# Patient Record
Sex: Male | Born: 1950 | Race: White | Hispanic: No | Marital: Married | Smoking: Never smoker
Health system: Southern US, Community
[De-identification: ages and names within clinical notes are randomized; demographics above are authoritative.]

## PROBLEM LIST (undated history)

## (undated) DIAGNOSIS — E785 Hyperlipidemia, unspecified: Secondary | ICD-10-CM

## (undated) DIAGNOSIS — R0602 Shortness of breath: Secondary | ICD-10-CM

## (undated) DIAGNOSIS — H9312 Tinnitus, left ear: Secondary | ICD-10-CM

## (undated) DIAGNOSIS — E039 Hypothyroidism, unspecified: Secondary | ICD-10-CM

## (undated) DIAGNOSIS — I2510 Atherosclerotic heart disease of native coronary artery without angina pectoris: Secondary | ICD-10-CM

## (undated) DIAGNOSIS — C44320 Squamous cell carcinoma of skin of unspecified parts of face: Secondary | ICD-10-CM

## (undated) DIAGNOSIS — N189 Chronic kidney disease, unspecified: Secondary | ICD-10-CM

## (undated) DIAGNOSIS — R55 Syncope and collapse: Secondary | ICD-10-CM

## (undated) DIAGNOSIS — I10 Essential (primary) hypertension: Secondary | ICD-10-CM

## (undated) DIAGNOSIS — I209 Angina pectoris, unspecified: Secondary | ICD-10-CM

## (undated) DIAGNOSIS — K219 Gastro-esophageal reflux disease without esophagitis: Secondary | ICD-10-CM

## (undated) HISTORY — PX: TONSILLECTOMY: SUR1361

## (undated) HISTORY — PX: OTHER SURGICAL HISTORY: SHX169

## (undated) HISTORY — DX: Essential (primary) hypertension: I10

## (undated) HISTORY — DX: Shortness of breath: R06.02

## (undated) HISTORY — PX: CARDIAC CATHETERIZATION: SHX172

## (undated) HISTORY — DX: Gastro-esophageal reflux disease without esophagitis: K21.9

## (undated) HISTORY — DX: Hyperlipidemia, unspecified: E78.5

## (undated) HISTORY — DX: Tinnitus, left ear: H93.12

## (undated) HISTORY — DX: Hypothyroidism, unspecified: E03.9

## (undated) HISTORY — DX: Syncope and collapse: R55

---
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
## 1998-08-03 ENCOUNTER — Ambulatory Visit (HOSPITAL_COMMUNITY): Admission: RE | Admit: 1998-08-03 | Discharge: 1998-08-03 | Payer: Self-pay | Admitting: Gastroenterology

## 2003-10-11 ENCOUNTER — Ambulatory Visit (HOSPITAL_COMMUNITY): Admission: RE | Admit: 2003-10-11 | Discharge: 2003-10-11 | Payer: Self-pay | Admitting: Gastroenterology

## 2004-09-18 ENCOUNTER — Ambulatory Visit (HOSPITAL_COMMUNITY): Admission: RE | Admit: 2004-09-18 | Discharge: 2004-09-18 | Payer: Self-pay | Admitting: Internal Medicine

## 2010-07-15 ENCOUNTER — Encounter: Payer: Self-pay | Admitting: Internal Medicine

## 2013-12-22 NOTE — Telephone Encounter (Signed)
This encounter was created in error - please disregard.

## 2014-06-30 DIAGNOSIS — D2311 Other benign neoplasm of skin of right eyelid, including canthus: Secondary | ICD-10-CM | POA: Diagnosis not present

## 2014-06-30 DIAGNOSIS — H43812 Vitreous degeneration, left eye: Secondary | ICD-10-CM | POA: Diagnosis not present

## 2014-06-30 DIAGNOSIS — H01001 Unspecified blepharitis right upper eyelid: Secondary | ICD-10-CM | POA: Diagnosis not present

## 2014-06-30 DIAGNOSIS — H2513 Age-related nuclear cataract, bilateral: Secondary | ICD-10-CM | POA: Diagnosis not present

## 2014-10-24 DIAGNOSIS — R55 Syncope and collapse: Secondary | ICD-10-CM | POA: Diagnosis not present

## 2014-10-24 DIAGNOSIS — R0602 Shortness of breath: Secondary | ICD-10-CM | POA: Diagnosis not present

## 2014-10-24 DIAGNOSIS — E785 Hyperlipidemia, unspecified: Secondary | ICD-10-CM | POA: Diagnosis not present

## 2014-10-24 DIAGNOSIS — Z6823 Body mass index (BMI) 23.0-23.9, adult: Secondary | ICD-10-CM | POA: Diagnosis not present

## 2014-10-24 DIAGNOSIS — I10 Essential (primary) hypertension: Secondary | ICD-10-CM | POA: Diagnosis not present

## 2014-10-28 DIAGNOSIS — I10 Essential (primary) hypertension: Secondary | ICD-10-CM | POA: Diagnosis not present

## 2014-10-28 DIAGNOSIS — E039 Hypothyroidism, unspecified: Secondary | ICD-10-CM | POA: Diagnosis not present

## 2014-10-28 DIAGNOSIS — Z125 Encounter for screening for malignant neoplasm of prostate: Secondary | ICD-10-CM | POA: Diagnosis not present

## 2014-10-28 DIAGNOSIS — E785 Hyperlipidemia, unspecified: Secondary | ICD-10-CM | POA: Diagnosis not present

## 2014-11-04 DIAGNOSIS — I10 Essential (primary) hypertension: Secondary | ICD-10-CM | POA: Diagnosis not present

## 2014-11-04 DIAGNOSIS — D649 Anemia, unspecified: Secondary | ICD-10-CM | POA: Diagnosis not present

## 2014-11-04 DIAGNOSIS — Z0000 Encounter for general adult medical examination without abnormal findings: Secondary | ICD-10-CM | POA: Diagnosis not present

## 2014-11-04 DIAGNOSIS — E039 Hypothyroidism, unspecified: Secondary | ICD-10-CM | POA: Diagnosis not present

## 2014-11-04 DIAGNOSIS — I129 Hypertensive chronic kidney disease with stage 1 through stage 4 chronic kidney disease, or unspecified chronic kidney disease: Secondary | ICD-10-CM | POA: Diagnosis not present

## 2014-11-04 DIAGNOSIS — E785 Hyperlipidemia, unspecified: Secondary | ICD-10-CM | POA: Diagnosis not present

## 2014-11-04 DIAGNOSIS — N401 Enlarged prostate with lower urinary tract symptoms: Secondary | ICD-10-CM | POA: Diagnosis not present

## 2014-11-04 DIAGNOSIS — K219 Gastro-esophageal reflux disease without esophagitis: Secondary | ICD-10-CM | POA: Diagnosis not present

## 2014-11-11 DIAGNOSIS — Z1212 Encounter for screening for malignant neoplasm of rectum: Secondary | ICD-10-CM | POA: Diagnosis not present

## 2014-11-14 ENCOUNTER — Encounter: Payer: Self-pay | Admitting: *Deleted

## 2014-11-16 ENCOUNTER — Encounter: Payer: Self-pay | Admitting: *Deleted

## 2014-11-16 ENCOUNTER — Ambulatory Visit (INDEPENDENT_AMBULATORY_CARE_PROVIDER_SITE_OTHER): Payer: Commercial Managed Care - HMO | Admitting: Internal Medicine

## 2014-11-16 VITALS — BP 156/90 | HR 57 | Ht 72.0 in | Wt 177.0 lb

## 2014-11-16 DIAGNOSIS — R55 Syncope and collapse: Secondary | ICD-10-CM

## 2014-11-16 DIAGNOSIS — R0609 Other forms of dyspnea: Secondary | ICD-10-CM

## 2014-11-16 DIAGNOSIS — G478 Other sleep disorders: Secondary | ICD-10-CM | POA: Diagnosis not present

## 2014-11-16 DIAGNOSIS — G4730 Sleep apnea, unspecified: Secondary | ICD-10-CM

## 2014-11-16 NOTE — Patient Instructions (Signed)
Medication Instructions:  Your physician recommends that you continue on your current medications as directed. Please refer to the Current Medication list given to you today.  Labwork: None ordered  Testing/Procedures: Your physician has requested that you have an echocardiogram. Echocardiography is a painless test that uses sound waves to create images of your heart. It provides your doctor with information about the size and shape of your heart and how well your heart's chambers and valves are working. This procedure takes approximately one hour. There are no restrictions for this procedure.  Your physician has requested that you have a stress myoview. For further information please visit [email or web address]. Please follow instruction sheet as given.  Your physician has recommended that you have a sleep study. This test records several body functions during sleep, including: brain activity, eye movement, oxygen and carbon dioxide blood levels, heart rate and rhythm, breathing rate and rhythm, the flow of air through your mouth and nose, snoring, body muscle movements, and chest and belly movement.  Follow-Up: Your physician wants you to follow-up in: 6 months with Dr. Caryl Comes. You will receive a reminder letter in the mail two months in advance. If you don't receive a letter, please call our office to schedule the follow-up appointment.   Thank you for choosing [facility]!!

## 2014-11-16 NOTE — Progress Notes (Signed)
ELECTROPHYSIOLOGY CONSULT NOTE  Patient ID: Cristian Cantrell, MRN: [id number], DOB/AGE: 01-16-1951 64 y.o. Admit date: (Not on file) Date of Consult: 11/16/2014  Primary Physician: Geoffery Lyons, MD Primary Cardiologistnew    Chief Complaint: suncope and dyspnea on exertion    HPI Cristian Cantrell is a 64 y.o. male  Seen because of an episode of syncope. He has a long-standing history of hypertension 5-10 years and a couple year history of orthostatic lightheadedness. He does not seem to be aggravated by showers or disease. He typically is associated with very abrupt changes in position. He was playing [employer] recently with his grandsons he was squatting to pick up Frisbee and a one occasion he stepped up and got dizzy and then collapsed to the ground. He was seen by family medical peoples  Described Him As Being Relatively Pale. They Are Medical People, but Apparently Did Recheck the Pulse Although This May Be Referred by Same That He Was "Had a Very Low Blood Pressure".  His dizziness has been problematic for some time and it was felt to be related to his high blood pressure that was made worse by augmented hypertensive therapy.  He also is complaints over the last 2-3 months of exertional dyspnea. This is notable climbing a flight of stairs and typically takes a couple of minutes to fully abate. It is unassociated with chest discomfort. Cardiac risk factors are notable for blood pressure and hyperlipidemia with LDL of 140+,  negative for family history./  It seems to be a little bit less problematic now that was a month or so ago. It was notably predictable. He was unaware accompanying palpitations.  He has not had problems with orthopnea nocturnal dyspnea or peripheral edema.     Past Medical History  Diagnosis Date  . Syncope   . HLD (hyperlipidemia)   . SOB (shortness of breath)   . HTN (hypertension)   . GERD (gastroesophageal reflux disease)   . Tinnitus of left ear   .  Hypothyroidism       Surgical History: History reviewed. No pertinent past surgical history.   Home Meds: Prior to Admission medications   Medication Sig Start Date End Date Taking? Authorizing Provider  amLODipine (NORVASC) 5 MG tablet Take 2.5 mg by mouth daily.    Yes Historical Provider, MD  BYSTOLIC 10 MG tablet Take 5 mg by mouth daily.  08/30/14  Yes Historical Provider, MD  cholecalciferol (VITAMIN D) 1000 UNITS tablet Take 1,000 Units by mouth daily.   Yes Historical Provider, MD  iron polysaccharides (NIFEREX) 150 MG capsule Take 150 mg by mouth 2 (two) times daily.   Yes Historical Provider, MD  levothyroxine (SYNTHROID, LEVOTHROID) 50 MCG tablet Take 50 mcg by mouth daily.  11/04/14  Yes Historical Provider, MD  losartan (COZAAR) 100 MG tablet Take 100 mg by mouth daily.   Yes Historical Provider, MD  omeprazole (PRILOSEC) 20 MG capsule Take 20 mg by mouth daily. 10/12/14  Yes Historical Provider, MD  tamsulosin (FLOMAX) 0.4 MG CAPS capsule Take 0.4 mg by mouth daily. 08/30/14  Yes Historical Provider, MD       Allergies: Not on File  History   Social History  . Marital Status: Married    Spouse Name: N/A  . Number of Children: N/A  . Years of Education: N/A   Occupational History  . Not on file.   Social History Main Topics  . Smoking status: Never Smoker   . Smokeless tobacco: Not  on file  . Alcohol Use: No  . Drug Use: No  . Sexual Activity: Not on file   Other Topics Concern  . Not on file   Social History Narrative     Family History  Problem Relation Age of Onset  . Hypertension Mother   . Parkinson's disease Father      ROS:  Please see the history of present illness.   Negative except a history of bleeding from his bowels cause of which was never elucidated. All other systems reviewed and negative.    Physical Exam:   Blood pressure 156/90, pulse 57, height 6' (1.829 m), weight 177 lb (80.287 kg). General: Well developed, well nourished male in  no acute distress. Head: Normocephalic, atraumatic, sclera non-icteric, no xanthomas, nares are without discharge. EENT: normal Lymph Nodes:  none Back: without scoliosis/kyphosis  no CVA tendersness Neck: Negative for carotid bruits. JVD not elevated. Lungs: Clear bilaterally to auscultation without wheezes, rales, or rhonchi. Breathing is unlabored. Heart: RRR with S1 S2. No  murmur , rubs, or gallops appreciated. Abdomen: Soft, non-tender, non-distended with normoactive bowel sounds. No hepatomegaly. No rebound/guarding. No obvious abdominal masses. Msk:  Strength and tone appear normal for age. Extremities: No clubbing or cyanosis. No edema.  Distal pedal pulses are 2+ and equal bilaterally. Skin: Warm and Dry Neuro: Alert and oriented X 3. CN III-XII intact Grossly normal sensory and motor function . Psych:  Responds to questions appropriately with a normal affect.      Labs: Cardiac Enzymes No results for input(s): CKTOTAL, CKMB, TROPONINI in the last 72 hours. CBC No results found for: WBC, HGB, HCT, MCV, PLT PROTIME: No results for input(s): LABPROT, INR in the last 72 hours. Chemistry No results for input(s): NA, K, CL, CO2, BUN, CREATININE, CALCIUM, PROT, BILITOT, ALKPHOS, ALT, AST, GLUCOSE in the last 168 hours.  Invalid input(s): LABALBU Lipids No results found for: CHOL, HDL, LDLCALC, TRIG BNP No results found for: PROBNP Thyroid Function Tests: No results for input(s): TSH, T4TOTAL, T3FREE, THYROIDAB in the last 72 hours.  Invalid input(s): FREET3    Miscellaneous No results found for: DDIMER  Radiology/Studies:  No results found.  EKG: NSR 60  19/09/41 O/w normal   Assessment and Plan:  Hypertension-poorly controlled  Sleep disordered breathing  Syncope  Dyspnea on exertion  The patient has syncope which is almost certainly orthostatic in the context of orthostatic lightheadedness. He has moderately long-standing hypertension for which  he takes  multiple medications. This is going to be a challenge. We discussed the interface between hypertension vascular compliance and orthostatic lightheadedness.  We have discussed the physiology of orthostatic lightheadedness and the importance of maintaining volume status, albeit without salt, avoidance of triggers i.e. heat, the role of isometric contraction to prime the system and mitigate symptoms, and a long-term interactions between hypertension and poor vascular compliance and orthostatic intolerance. We also discussed the aggravating effect of his alpha-blocker but this apparently is quite important in his urinary flow.  For now looking at alternative mechanisms for treating his hypertension that obviate the need for up titration of medications seems prudent. We discussed the value of high potassium diet, exercise, and also given his sleep disordered breathing, the importance of a sleep study and aggressive therapy as this is the most common reversible cause of hypertension.  His dyspnea on exertion is concerning. It may well be an ischemic equivalent given its reproducibility and its time of offset. We will undertake Myoview scanning with stress  testing as his pretest likelihood is sufficiently high and I think standard stress testing is insufficiently sensitive.  We will also undertake an echocardiogram given his syncope looking for evidence of structural heart disease valvular issues that might help further illuminate the cause of dyspnea.  He has symptoms of cold intolerance. I wonder whether this related to one of his drugs and is suggested that we undertake a surgical drug limitation trial.     Virl Axe

## 2014-12-01 ENCOUNTER — Telehealth (HOSPITAL_COMMUNITY): Payer: Self-pay

## 2014-12-01 NOTE — Telephone Encounter (Signed)
Left message on voicemail in reference to upcoming appointment scheduled for 12-06-2014. Phone number given for a call back so details instructions can be given. Makayela Secrest A   

## 2014-12-05 ENCOUNTER — Telehealth (HOSPITAL_COMMUNITY): Payer: Self-pay | Admitting: *Deleted

## 2014-12-05 NOTE — Telephone Encounter (Signed)
Left message on voicemail in reference to upcoming appointment scheduled for 12/06/14 Left message on voicemail in reference to upcoming appointment scheduled for Sencere Symonette, Ranae Palms  .

## 2014-12-06 ENCOUNTER — Other Ambulatory Visit: Payer: Self-pay

## 2014-12-06 ENCOUNTER — Ambulatory Visit (HOSPITAL_BASED_OUTPATIENT_CLINIC_OR_DEPARTMENT_OTHER): Payer: Commercial Managed Care - HMO

## 2014-12-06 ENCOUNTER — Ambulatory Visit (HOSPITAL_COMMUNITY): Payer: Commercial Managed Care - HMO | Attending: Internal Medicine

## 2014-12-06 DIAGNOSIS — R9439 Abnormal result of other cardiovascular function study: Secondary | ICD-10-CM | POA: Diagnosis not present

## 2014-12-06 DIAGNOSIS — I071 Rheumatic tricuspid insufficiency: Secondary | ICD-10-CM | POA: Diagnosis not present

## 2014-12-06 DIAGNOSIS — R55 Syncope and collapse: Secondary | ICD-10-CM

## 2014-12-06 DIAGNOSIS — R0609 Other forms of dyspnea: Secondary | ICD-10-CM | POA: Diagnosis not present

## 2014-12-06 LAB — MYOCARDIAL PERFUSION IMAGING
CHL CUP NUCLEAR SSS: 4
CHL CUP RESTING HR STRESS: 47 {beats}/min
CSEPED: 8 min
CSEPEDS: 0 s
Estimated workload: 10.1 METS
LHR: 0.3
LVDIAVOL: 104 mL
LVSYSVOL: 47 mL
MPHR: 154 {beats}/min
NUC STRESS TID: 1.01
Nuc Stress EF: 55 %
Peak HR: 141 {beats}/min
Percent HR: 91 %
SDS: 3
SRS: 1

## 2014-12-06 MED ORDER — TECHNETIUM TC 99M SESTAMIBI GENERIC - CARDIOLITE
11.0000 | Freq: Once | INTRAVENOUS | Status: AC | PRN
  Administered 2014-12-06: 11 via INTRAVENOUS

## 2014-12-06 MED ORDER — TECHNETIUM TC 99M SESTAMIBI GENERIC - CARDIOLITE
33.0000 | Freq: Once | INTRAVENOUS | Status: AC | PRN
  Administered 2014-12-06: 33 via INTRAVENOUS

## 2014-12-12 ENCOUNTER — Encounter: Payer: Self-pay | Admitting: *Deleted

## 2014-12-13 ENCOUNTER — Ambulatory Visit (INDEPENDENT_AMBULATORY_CARE_PROVIDER_SITE_OTHER): Payer: Commercial Managed Care - HMO | Admitting: Internal Medicine

## 2014-12-13 ENCOUNTER — Encounter: Payer: Self-pay | Admitting: Internal Medicine

## 2014-12-13 VITALS — BP 134/84 | HR 61 | Ht 72.0 in | Wt 179.6 lb

## 2014-12-13 DIAGNOSIS — R0609 Other forms of dyspnea: Secondary | ICD-10-CM

## 2014-12-13 DIAGNOSIS — R55 Syncope and collapse: Secondary | ICD-10-CM | POA: Diagnosis not present

## 2014-12-13 NOTE — Patient Instructions (Signed)
Medication Instructions:  Your physician recommends that you continue on your current medications as directed. Please refer to the Current Medication list given to you today.  Labwork: None ordered  Testing/Procedures: Your physician has requested that you have a cardiac MRI. Cardiac MRI uses a computer to create images of your heart as its beating, producing both still and moving pictures of your heart and major blood vessels. For further information please visit [email or web address] Please follow the instruction sheet given to you today for more information.  Follow-Up: Continue current follow up in November with Dr. Caryl Comes.  Thank you for choosing [facility]!!

## 2014-12-13 NOTE — Progress Notes (Signed)
      Patient Care Team: Burnard Bunting, MD as PCP - General (Internal Medicine)   HPI  Cristian Cantrell is a 64 y.o. male Seen in followup of syncope for which during evaluation Myoview was noted to be abnormal with a resting perfusion defect and an peri-infarct "ischemia".  He continues to struggle with dizziness. He has had no recurrent syncope.  Echocardiogram demonstrated normal LV function. Myoview demonstrated>> There is a medium defect of mild severity present in the mid anteroseptal, apical anterior and apical septal location. The defect is partially reversible suggestive of ischemia  Past Medical History  Diagnosis Date  . Syncope   . HLD (hyperlipidemia)   . SOB (shortness of breath)   . HTN (hypertension)   . GERD (gastroesophageal reflux disease)   . Tinnitus of left ear   . Hypothyroidism     Past Surgical History  Procedure Laterality Date  . None      Current Outpatient Prescriptions  Medication Sig Dispense Refill  . amLODipine (NORVASC) 5 MG tablet Take 2.5 mg by mouth daily.     Marland Kitchen BYSTOLIC 10 MG tablet Take 5 mg by mouth daily.     . cholecalciferol (VITAMIN D) 1000 UNITS tablet Take 1,000 Units by mouth daily.    . iron polysaccharides (NIFEREX) 150 MG capsule Take 150 mg by mouth 2 (two) times daily.    Marland Kitchen levothyroxine (SYNTHROID, LEVOTHROID) 50 MCG tablet Take 50 mcg by mouth daily.     Marland Kitchen losartan (COZAAR) 100 MG tablet Take 100 mg by mouth daily.    Marland Kitchen omeprazole (PRILOSEC) 20 MG capsule Take 20 mg by mouth daily.    . tamsulosin (FLOMAX) 0.4 MG CAPS capsule Take 0.4 mg by mouth daily.     No current facility-administered medications for this visit.    No Known Allergies  Review of Systems negative except from HPI and PMH  Physical Exam BP 134/84 mmHg  Pulse 61  Ht 6' (1.829 m)  Wt 179 lb 9.6 oz (81.466 kg)  BMI 24.35 kg/m2  SpO2 99% Well developed and well nourished in no acute distress HENT normal E scleral and icterus clear Neck  Supple JVP flat; carotids brisk and full Clear to ausculation  Regular rate and rhythm, no murmurs gallops or rub Soft with active bowel sounds No clubbing cyanosis  Edema Alert and oriented, grossly normal motor and sensory function Skin Warm and Dry    Assessment and  Plan  Syncope  Abnormal Myoview  The patient and his wife and I reviewed the Myoview images indicative of a prior perfusion defect with some peri-infarct ischemia. In the context of his syncope, importance of this is heightened as the potential for malignant arrhythmias to rise from the presence of substrate. We have discussed 2 strategies, the first would be cardiac MR to exclude a false positive stress scan. In the event tha MRI were normal, no further cardiac evaluation would be indicated. If the spine MRI demonstrated a prior infarction, catheterization and negative EP testing would be appropriate probably followed by, in the event that he were noninducible, implantation of a loop recorder.  I have recommended that he follow-up with his PCP regarding his alpha-blocker for urinary hesitancy as he struggles with dizziness  We spent more than 50% of our >45 min visit in face to face counseling regarding the above

## 2014-12-14 ENCOUNTER — Encounter: Payer: Self-pay | Admitting: Internal Medicine

## 2014-12-19 ENCOUNTER — Other Ambulatory Visit: Payer: Self-pay

## 2014-12-20 ENCOUNTER — Telehealth: Payer: Self-pay | Admitting: *Deleted

## 2014-12-20 DIAGNOSIS — Z01812 Encounter for preprocedural laboratory examination: Secondary | ICD-10-CM

## 2014-12-20 NOTE — Telephone Encounter (Signed)
Patient having cardiac MRI on Friday. Called to arrange pre-lab.  Patient will come by office tomorrow for lab work

## 2014-12-21 ENCOUNTER — Other Ambulatory Visit (INDEPENDENT_AMBULATORY_CARE_PROVIDER_SITE_OTHER): Payer: Commercial Managed Care - HMO | Admitting: *Deleted

## 2014-12-21 DIAGNOSIS — Z01812 Encounter for preprocedural laboratory examination: Secondary | ICD-10-CM

## 2014-12-21 LAB — BASIC METABOLIC PANEL
BUN: 13 mg/dL (ref 6–23)
CALCIUM: 9.4 mg/dL (ref 8.4–10.5)
CO2: 31 meq/L (ref 19–32)
CREATININE: 1.42 mg/dL (ref 0.40–1.50)
Chloride: 105 mEq/L (ref 96–112)
GFR: 52.97 mL/min — AB (ref >60.00)
GLUCOSE: 97 mg/dL (ref 70–99)
Potassium: 4 mEq/L (ref 3.5–5.1)
SODIUM: 141 meq/L (ref 135–145)

## 2014-12-23 ENCOUNTER — Ambulatory Visit (HOSPITAL_COMMUNITY)
Admission: RE | Admit: 2014-12-23 | Discharge: 2014-12-23 | Disposition: A | Discharge status: Home / Self Care | Payer: Commercial Managed Care - HMO | Source: Ambulatory Visit | Attending: Internal Medicine | Admitting: Internal Medicine

## 2014-12-23 DIAGNOSIS — R55 Syncope and collapse: Secondary | ICD-10-CM | POA: Insufficient documentation

## 2014-12-23 DIAGNOSIS — R0609 Other forms of dyspnea: Secondary | ICD-10-CM

## 2014-12-23 MED ORDER — GADOBENATE DIMEGLUMINE 529 MG/ML IV SOLN
27.0000 mL | Freq: Once | INTRAVENOUS | Status: AC | PRN
  Administered 2014-12-23: 27 mL via INTRAVENOUS

## 2014-12-29 ENCOUNTER — Encounter: Payer: Self-pay | Admitting: Internal Medicine

## 2014-12-29 ENCOUNTER — Other Ambulatory Visit (HOSPITAL_COMMUNITY): Payer: Commercial Managed Care - HMO

## 2014-12-29 NOTE — Telephone Encounter (Signed)
New message ° ° ° ° ° °Want MRI results  °

## 2015-01-03 NOTE — Telephone Encounter (Signed)
This encounter was created in error - please disregard.

## 2015-01-27 ENCOUNTER — Telehealth: Payer: Self-pay | Admitting: Cardiology

## 2015-01-27 NOTE — Telephone Encounter (Signed)
LM on pt's home# sleep study is now authorized.

## 2015-01-30 ENCOUNTER — Ambulatory Visit (HOSPITAL_BASED_OUTPATIENT_CLINIC_OR_DEPARTMENT_OTHER): Payer: Commercial Managed Care - HMO | Attending: Internal Medicine

## 2015-01-30 DIAGNOSIS — R0683 Snoring: Secondary | ICD-10-CM | POA: Diagnosis not present

## 2015-01-30 DIAGNOSIS — G4733 Obstructive sleep apnea (adult) (pediatric): Secondary | ICD-10-CM | POA: Diagnosis not present

## 2015-01-30 DIAGNOSIS — I10 Essential (primary) hypertension: Secondary | ICD-10-CM | POA: Diagnosis present

## 2015-01-30 DIAGNOSIS — G4730 Sleep apnea, unspecified: Secondary | ICD-10-CM

## 2015-01-30 DIAGNOSIS — G478 Other sleep disorders: Secondary | ICD-10-CM

## 2015-02-01 ENCOUNTER — Telehealth: Payer: Self-pay | Admitting: Cardiology

## 2015-02-01 NOTE — Telephone Encounter (Signed)
Please let patient know that sleep study showed no significant sleep apnea.    

## 2015-02-01 NOTE — Sleep Study (Signed)
SLEEP STUDY    Patient Name: Cristian Cantrell, Cristian Cantrell Date: 01/30/2015 MRN: [id number] Gender: Male D.O.B: 11-29-1950 Age (years): 64 Referring Provider: Virl Axe Interpreting Physician: Fransico Him MD, ABSM RPSGT: Madelon Lips  Height (inches): 72 Weight (lbs): 180 BMI: 24 Neck Size: 15.00   CLINICAL INFORMATION  Sleep Study Type: NPSG  Indication for sleep study: Hypertension, Snoring  Epworth Sleepiness Score: 10  MEDICATIONS Medications taken by the patient : Amlodipine, Bystolic, Vitamin D, Synthroid, Iron, , Cozaar, Prilosec, Flonase Medications administered by patient during sleep study : No sleep medicine administered.  SLEEP STUDY TECHNIQUE As per the AASM Manual for the Scoring of Sleep and Associated Events v2.3 (April 2016) with a hypopnea requiring 4% desaturations.  The channels recorded and monitored were frontal, central and occipital EEG, electrooculogram (EOG), submentalis EMG (chin), nasal and oral airflow, thoracic and abdominal wall motion, anterior tibialis EMG, snore microphone, electrocardiogram, and pulse oximetry.  RESPIRATORY PARAMETERS There were a total of 22 respiratory disturbances out of which 2 were apneas ( 2 obstructive, 0 mixed, 0 central) and 20 hypopneas. The apnea/hypopnea index (AHI) was 5.0 events/hour. The central sleep apnea index was 0.0 events/hour. The REM AHI was 2.2 events/hour and NREM AHI was 5.3 events/hour. The supine AHI was 27.5 events/hour and the non supine AHI was 0.81 supine during 15.71% of sleep. Respiratory disturbances were associated with oxygen desaturation down to a nadir of 80.00% during sleep. The mean oxygen saturation during the study was 93.54%.  SLEEP ARCHITECTURE The study was initiated at 10:13:57 PM and terminated at 4:47:04 AM. The total recorded time was 393.1 minutes. EEG confirmed total sleep time was 264.2 minutes yielding a sleep efficiency of 67.2%. Sleep onset after lights out was 27.9  minutes with a REM latency of 78.5 minutes. The patient spent 11.92% of the night in stage N1 sleep, 77.86% in stage N2 sleep, 0.00% in stage N3 and 10.22% in REM. Wake after sleep onset (WASO) was 101.0 minutes. The Arousal Index was 5.9/hour. Split Night criteria was not met.  LEG MOVEMENT DATA The total Periodic Limb Movements of Sleep (PLMS) were 98. The PLMS index was 22.26 .  CARDIAC DATA The 2 lead EKG demonstrated sinus rhythm. The mean heart rate was 54.87 beats per minute. Other EKG findings include: None.  IMPRESSIONS Very mild obstructive sleep apnea occurred during this study (AHI = 5.0/hour).  Events mainly occurred on the supine position. No significant central sleep apnea occurred during this study (CAI = 0.0). Severe oxygen desaturation was noted during this study (Min O2 = 80.00).  The time spent with oxygen saturations < 88% was 2.5 minutes.  The patient snored with Moderate snoring volume during the diagnostic portion of the study. No cardiac abnormalities were noted during this study. Mild periodic limb movements of sleep occurred during the study.  DIAGNOSIS Very mild Obstructive Sleep Apnea/Hypopnea Sydnrome.    RECOMMENDATIONS Given minimal AHI with minimal oxygen saturations <88%, no indication for CPAP therapy at this time. Positional therapy avoiding supine position during sleep. Avoid alcohol, sedatives and other CNS depressants that may worsen sleep apnea and disrupt normal sleep architecture. Sleep hygiene should be reviewed to assess factors that may improve sleep quality. Weight management and regular exercise should be initiated or continued.  Signed: Fransico Him, MD ABSM Diplomate, American Board of Sleep Medicine

## 2015-02-02 NOTE — Telephone Encounter (Signed)
Patient informed of results.  Stated verbal understanding 

## 2015-05-03 DIAGNOSIS — D239 Other benign neoplasm of skin, unspecified: Secondary | ICD-10-CM | POA: Diagnosis not present

## 2015-05-03 DIAGNOSIS — C44320 Squamous cell carcinoma of skin of unspecified parts of face: Secondary | ICD-10-CM

## 2015-05-03 DIAGNOSIS — C44321 Squamous cell carcinoma of skin of nose: Secondary | ICD-10-CM | POA: Diagnosis not present

## 2015-05-03 DIAGNOSIS — L821 Other seborrheic keratosis: Secondary | ICD-10-CM | POA: Diagnosis not present

## 2015-05-03 HISTORY — DX: Squamous cell carcinoma of skin of unspecified parts of face: C44.320

## 2015-05-03 HISTORY — PX: SKIN BIOPSY: SHX1

## 2015-05-15 DIAGNOSIS — I10 Essential (primary) hypertension: Secondary | ICD-10-CM | POA: Diagnosis not present

## 2015-05-15 DIAGNOSIS — N401 Enlarged prostate with lower urinary tract symptoms: Secondary | ICD-10-CM | POA: Diagnosis not present

## 2015-05-15 DIAGNOSIS — K219 Gastro-esophageal reflux disease without esophagitis: Secondary | ICD-10-CM | POA: Diagnosis not present

## 2015-05-15 DIAGNOSIS — E039 Hypothyroidism, unspecified: Secondary | ICD-10-CM | POA: Diagnosis not present

## 2015-05-15 DIAGNOSIS — I129 Hypertensive chronic kidney disease with stage 1 through stage 4 chronic kidney disease, or unspecified chronic kidney disease: Secondary | ICD-10-CM | POA: Diagnosis not present

## 2015-05-15 DIAGNOSIS — D649 Anemia, unspecified: Secondary | ICD-10-CM | POA: Diagnosis not present

## 2015-05-15 DIAGNOSIS — N183 Chronic kidney disease, stage 3 (moderate): Secondary | ICD-10-CM | POA: Diagnosis not present

## 2015-05-15 DIAGNOSIS — E785 Hyperlipidemia, unspecified: Secondary | ICD-10-CM | POA: Diagnosis not present

## 2015-05-30 DIAGNOSIS — R0602 Shortness of breath: Secondary | ICD-10-CM | POA: Insufficient documentation

## 2015-05-30 DIAGNOSIS — R55 Syncope and collapse: Secondary | ICD-10-CM | POA: Insufficient documentation

## 2015-05-30 DIAGNOSIS — H9312 Tinnitus, left ear: Secondary | ICD-10-CM | POA: Insufficient documentation

## 2015-05-30 DIAGNOSIS — E782 Mixed hyperlipidemia: Secondary | ICD-10-CM | POA: Insufficient documentation

## 2015-05-30 DIAGNOSIS — E785 Hyperlipidemia, unspecified: Secondary | ICD-10-CM | POA: Insufficient documentation

## 2015-05-30 DIAGNOSIS — E039 Hypothyroidism, unspecified: Secondary | ICD-10-CM | POA: Insufficient documentation

## 2015-05-30 DIAGNOSIS — I10 Essential (primary) hypertension: Secondary | ICD-10-CM | POA: Insufficient documentation

## 2015-05-30 DIAGNOSIS — K219 Gastro-esophageal reflux disease without esophagitis: Secondary | ICD-10-CM | POA: Insufficient documentation

## 2015-06-01 ENCOUNTER — Ambulatory Visit (INDEPENDENT_AMBULATORY_CARE_PROVIDER_SITE_OTHER): Payer: Commercial Managed Care - HMO | Admitting: Internal Medicine

## 2015-06-01 ENCOUNTER — Encounter: Payer: Self-pay | Admitting: Internal Medicine

## 2015-06-01 VITALS — BP 124/72 | HR 76 | Ht 72.0 in | Wt 178.0 lb

## 2015-06-01 DIAGNOSIS — G478 Other sleep disorders: Secondary | ICD-10-CM

## 2015-06-01 DIAGNOSIS — R55 Syncope and collapse: Secondary | ICD-10-CM

## 2015-06-01 DIAGNOSIS — G4730 Sleep apnea, unspecified: Secondary | ICD-10-CM

## 2015-06-01 NOTE — Patient Instructions (Signed)
Medication Instructions:  Your physician recommends that you continue on your current medications as directed. Please refer to the Current Medication list given to you today.  Labwork: None ordered  Testing/Procedures: None ordered  Follow-Up: No follow up is needed at this time with Dr. Caryl Comes.  He will see you on an as needed basis.  Thank you for choosing [facility]!!   Alvis Lemmings, RN [phone]

## 2015-06-01 NOTE — Progress Notes (Signed)
      Patient Care Team: Burnard Bunting, MD as PCP - General (Internal Medicine)   HPI  Cristian Cantrell is a 65 y.o. male Seen in followup of syncope for which during evaluation Myoview was noted to be abnormal with a resting perfusion defect and an peri-infarct "ischemia".  He continues to struggle with dizziness. He has had no recurrent syncope.  Echocardiogram demonstrated normal LV function. Myoview demonstrated>> There is a medium defect of mild severity present in the mid anteroseptal, apical anterior and apical septal location. The defect is partially reversible suggestive of ischemia;   This was followed up by a  Cardiac MR which demonstrated no infarction.  Past Medical History  Diagnosis Date  . Syncope   . HLD (hyperlipidemia)   . SOB (shortness of breath)   . HTN (hypertension)   . GERD (gastroesophageal reflux disease)   . Tinnitus of left ear   . Hypothyroidism     Past Surgical History  Procedure Laterality Date  . None      Current Outpatient Prescriptions  Medication Sig Dispense Refill  . amLODipine (NORVASC) 5 MG tablet Take 2.5 mg by mouth daily.     Marland Kitchen BYSTOLIC 10 MG tablet Take 5 mg by mouth daily.     . cholecalciferol (VITAMIN D) 1000 UNITS tablet Take 1,000 Units by mouth daily.    Marland Kitchen levothyroxine (SYNTHROID, LEVOTHROID) 50 MCG tablet Take 50 mcg by mouth daily.     Marland Kitchen losartan (COZAAR) 100 MG tablet Take 100 mg by mouth daily.    Marland Kitchen omeprazole (PRILOSEC) 20 MG capsule Take 20 mg by mouth daily.    . tamsulosin (FLOMAX) 0.4 MG CAPS capsule Take 0.4 mg by mouth daily.     No current facility-administered medications for this visit.    No Known Allergies  Review of Systems negative except from HPI and PMH  Physical Exam BP 124/72 mmHg  Pulse 76  Ht 6' (1.829 m)  Wt 178 lb (80.74 kg)  BMI 24.14 kg/m2  SpO2 97% Well developed and well nourished in no acute distress HENT normal E scleral and icterus clear Neck Supple JVP flat; carotids  brisk and full Clear to ausculation  Regular rate and rhythm, no murmurs gallops or rub Soft with active bowel sounds No clubbing cyanosis  Edema Alert and oriented, grossly normal motor and sensory function Skin Warm and Dry    Assessment and  Plan  Syncope  Abnormal Myoview  The patient and his wife and I reviewed the Myoview images indicative of a prior perfusion defect with some peri-infarct ischemia. In the context of his syncope, importance of this is heightened as the potential for malignant arrhythmias to rise from the presence of substrate. We have discussed 2 strategies, the first would be cardiac MR to exclude a false positive stress scan. In the event tha MRI were normal, no further cardiac evaluation would be indicated. If the spine MRI demonstrated a prior infarction, catheterization and negative EP testing would be appropriate probably followed by, in the event that he were noninducible, implantation of a loop recorder.  I have recommended that he follow-up with his PCP regarding his alpha-blocker for urinary hesitancy as he struggles with dizziness  We spent more than 50% of our >45 min visit in face to face counseling regarding the above

## 2015-06-08 DIAGNOSIS — C44321 Squamous cell carcinoma of skin of nose: Secondary | ICD-10-CM | POA: Diagnosis not present

## 2015-07-04 DIAGNOSIS — H524 Presbyopia: Secondary | ICD-10-CM | POA: Diagnosis not present

## 2015-07-04 DIAGNOSIS — H43813 Vitreous degeneration, bilateral: Secondary | ICD-10-CM | POA: Diagnosis not present

## 2015-07-04 DIAGNOSIS — D2311 Other benign neoplasm of skin of right eyelid, including canthus: Secondary | ICD-10-CM | POA: Diagnosis not present

## 2015-07-04 DIAGNOSIS — H2513 Age-related nuclear cataract, bilateral: Secondary | ICD-10-CM | POA: Diagnosis not present

## 2015-10-27 DIAGNOSIS — Z125 Encounter for screening for malignant neoplasm of prostate: Secondary | ICD-10-CM | POA: Diagnosis not present

## 2015-10-27 DIAGNOSIS — E039 Hypothyroidism, unspecified: Secondary | ICD-10-CM | POA: Diagnosis not present

## 2015-10-27 DIAGNOSIS — E784 Other hyperlipidemia: Secondary | ICD-10-CM | POA: Diagnosis not present

## 2015-10-27 DIAGNOSIS — I10 Essential (primary) hypertension: Secondary | ICD-10-CM | POA: Diagnosis not present

## 2015-11-08 DIAGNOSIS — I129 Hypertensive chronic kidney disease with stage 1 through stage 4 chronic kidney disease, or unspecified chronic kidney disease: Secondary | ICD-10-CM | POA: Diagnosis not present

## 2015-11-08 DIAGNOSIS — E038 Other specified hypothyroidism: Secondary | ICD-10-CM | POA: Diagnosis not present

## 2015-11-08 DIAGNOSIS — E784 Other hyperlipidemia: Secondary | ICD-10-CM | POA: Diagnosis not present

## 2015-11-08 DIAGNOSIS — M25511 Pain in right shoulder: Secondary | ICD-10-CM | POA: Diagnosis not present

## 2015-11-08 DIAGNOSIS — N183 Chronic kidney disease, stage 3 (moderate): Secondary | ICD-10-CM | POA: Diagnosis not present

## 2015-11-08 DIAGNOSIS — N401 Enlarged prostate with lower urinary tract symptoms: Secondary | ICD-10-CM | POA: Diagnosis not present

## 2015-11-08 DIAGNOSIS — H8110 Benign paroxysmal vertigo, unspecified ear: Secondary | ICD-10-CM | POA: Diagnosis not present

## 2015-11-08 DIAGNOSIS — Z0000 Encounter for general adult medical examination without abnormal findings: Secondary | ICD-10-CM | POA: Diagnosis not present

## 2015-11-08 DIAGNOSIS — D6489 Other specified anemias: Secondary | ICD-10-CM | POA: Diagnosis not present

## 2015-11-23 DIAGNOSIS — Z1212 Encounter for screening for malignant neoplasm of rectum: Secondary | ICD-10-CM | POA: Diagnosis not present

## 2015-12-11 DIAGNOSIS — L821 Other seborrheic keratosis: Secondary | ICD-10-CM | POA: Diagnosis not present

## 2015-12-11 DIAGNOSIS — L570 Actinic keratosis: Secondary | ICD-10-CM | POA: Diagnosis not present

## 2015-12-11 DIAGNOSIS — D485 Neoplasm of uncertain behavior of skin: Secondary | ICD-10-CM | POA: Diagnosis not present

## 2015-12-11 DIAGNOSIS — D239 Other benign neoplasm of skin, unspecified: Secondary | ICD-10-CM | POA: Diagnosis not present

## 2016-05-08 DIAGNOSIS — R55 Syncope and collapse: Secondary | ICD-10-CM | POA: Diagnosis not present

## 2016-05-08 DIAGNOSIS — N183 Chronic kidney disease, stage 3 (moderate): Secondary | ICD-10-CM | POA: Diagnosis not present

## 2016-05-08 DIAGNOSIS — E038 Other specified hypothyroidism: Secondary | ICD-10-CM | POA: Diagnosis not present

## 2016-05-08 DIAGNOSIS — M25511 Pain in right shoulder: Secondary | ICD-10-CM | POA: Diagnosis not present

## 2016-05-08 DIAGNOSIS — L988 Other specified disorders of the skin and subcutaneous tissue: Secondary | ICD-10-CM | POA: Diagnosis not present

## 2016-05-08 DIAGNOSIS — H8113 Benign paroxysmal vertigo, bilateral: Secondary | ICD-10-CM | POA: Diagnosis not present

## 2016-05-08 DIAGNOSIS — E784 Other hyperlipidemia: Secondary | ICD-10-CM | POA: Diagnosis not present

## 2016-05-08 DIAGNOSIS — I129 Hypertensive chronic kidney disease with stage 1 through stage 4 chronic kidney disease, or unspecified chronic kidney disease: Secondary | ICD-10-CM | POA: Diagnosis not present

## 2016-05-08 DIAGNOSIS — D638 Anemia in other chronic diseases classified elsewhere: Secondary | ICD-10-CM | POA: Diagnosis not present

## 2016-08-12 DIAGNOSIS — D2311 Other benign neoplasm of skin of right eyelid, including canthus: Secondary | ICD-10-CM | POA: Diagnosis not present

## 2016-08-12 DIAGNOSIS — H43813 Vitreous degeneration, bilateral: Secondary | ICD-10-CM | POA: Diagnosis not present

## 2016-08-12 DIAGNOSIS — H2513 Age-related nuclear cataract, bilateral: Secondary | ICD-10-CM | POA: Diagnosis not present

## 2016-08-12 DIAGNOSIS — H524 Presbyopia: Secondary | ICD-10-CM | POA: Diagnosis not present

## 2016-11-05 DIAGNOSIS — Z124 Encounter for screening for malignant neoplasm of cervix: Secondary | ICD-10-CM | POA: Diagnosis not present

## 2016-11-05 DIAGNOSIS — E038 Other specified hypothyroidism: Secondary | ICD-10-CM | POA: Diagnosis not present

## 2016-11-05 DIAGNOSIS — Z125 Encounter for screening for malignant neoplasm of prostate: Secondary | ICD-10-CM | POA: Diagnosis not present

## 2016-11-05 DIAGNOSIS — I10 Essential (primary) hypertension: Secondary | ICD-10-CM | POA: Diagnosis not present

## 2016-11-05 DIAGNOSIS — E784 Other hyperlipidemia: Secondary | ICD-10-CM | POA: Diagnosis not present

## 2016-11-11 DIAGNOSIS — Z1212 Encounter for screening for malignant neoplasm of rectum: Secondary | ICD-10-CM | POA: Diagnosis not present

## 2016-11-11 DIAGNOSIS — H8110 Benign paroxysmal vertigo, unspecified ear: Secondary | ICD-10-CM | POA: Diagnosis not present

## 2016-11-11 DIAGNOSIS — I10 Essential (primary) hypertension: Secondary | ICD-10-CM | POA: Diagnosis not present

## 2016-11-11 DIAGNOSIS — N183 Chronic kidney disease, stage 3 (moderate): Secondary | ICD-10-CM | POA: Diagnosis not present

## 2016-11-11 DIAGNOSIS — E038 Other specified hypothyroidism: Secondary | ICD-10-CM | POA: Diagnosis not present

## 2016-11-11 DIAGNOSIS — Z0000 Encounter for general adult medical examination without abnormal findings: Secondary | ICD-10-CM | POA: Diagnosis not present

## 2016-11-11 DIAGNOSIS — I129 Hypertensive chronic kidney disease with stage 1 through stage 4 chronic kidney disease, or unspecified chronic kidney disease: Secondary | ICD-10-CM | POA: Diagnosis not present

## 2016-11-11 DIAGNOSIS — D638 Anemia in other chronic diseases classified elsewhere: Secondary | ICD-10-CM | POA: Diagnosis not present

## 2016-11-11 DIAGNOSIS — M25511 Pain in right shoulder: Secondary | ICD-10-CM | POA: Diagnosis not present

## 2016-11-11 DIAGNOSIS — R55 Syncope and collapse: Secondary | ICD-10-CM | POA: Diagnosis not present

## 2016-12-03 IMAGING — NM NM MYOCAR MULTI W/ SPECT
3 series · 18 of 18 positions shown · non-contrast
Comparison: none

[Series 1: rest · 6.51mm/px · 6 of 64 frames shown]
[frame 6/64]
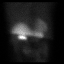
[frame 16/64]
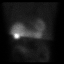
[frame 27/64]
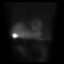
[frame 38/64]
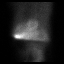
[frame 48/64]
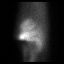
[frame 59/64]
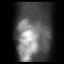

[Series 2: stress · 6.51mm/px · 6 of 64 frames shown (1 of 2)]
[frame 6/64]
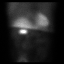
[frame 16/64]
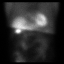
[frame 27/64]
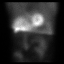
[frame 38/64]
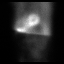
[frame 48/64]
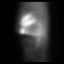
[frame 59/64]
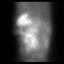

[Series 2: stress · 6.51mm/px · 6 of 512 frames shown (2 of 2)]
[frame 43/512]
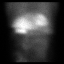
[frame 128/512]
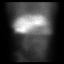
[frame 214/512]
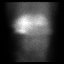
[frame 299/512]
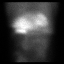
[frame 384/512]
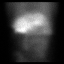
[frame 470/512]
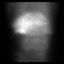

[18 of 18 positions shown; findings below may reference images not displayed]

Canned report from images found in remote index.

Refer to host system for actual result text.

## 2017-05-05 DIAGNOSIS — I129 Hypertensive chronic kidney disease with stage 1 through stage 4 chronic kidney disease, or unspecified chronic kidney disease: Secondary | ICD-10-CM | POA: Diagnosis not present

## 2017-05-05 DIAGNOSIS — D638 Anemia in other chronic diseases classified elsewhere: Secondary | ICD-10-CM | POA: Diagnosis not present

## 2017-05-05 DIAGNOSIS — Z1389 Encounter for screening for other disorder: Secondary | ICD-10-CM | POA: Diagnosis not present

## 2017-05-05 DIAGNOSIS — Z23 Encounter for immunization: Secondary | ICD-10-CM | POA: Diagnosis not present

## 2017-05-05 DIAGNOSIS — E038 Other specified hypothyroidism: Secondary | ICD-10-CM | POA: Diagnosis not present

## 2017-05-05 DIAGNOSIS — I10 Essential (primary) hypertension: Secondary | ICD-10-CM | POA: Diagnosis not present

## 2017-05-05 DIAGNOSIS — M25511 Pain in right shoulder: Secondary | ICD-10-CM | POA: Diagnosis not present

## 2017-05-05 DIAGNOSIS — E7849 Other hyperlipidemia: Secondary | ICD-10-CM | POA: Diagnosis not present

## 2017-05-05 DIAGNOSIS — N183 Chronic kidney disease, stage 3 (moderate): Secondary | ICD-10-CM | POA: Diagnosis not present

## 2017-08-15 DIAGNOSIS — D23112 Other benign neoplasm of skin of right lower eyelid, including canthus: Secondary | ICD-10-CM | POA: Diagnosis not present

## 2017-08-15 DIAGNOSIS — H43813 Vitreous degeneration, bilateral: Secondary | ICD-10-CM | POA: Diagnosis not present

## 2017-08-15 DIAGNOSIS — H5213 Myopia, bilateral: Secondary | ICD-10-CM | POA: Diagnosis not present

## 2017-08-15 DIAGNOSIS — H2513 Age-related nuclear cataract, bilateral: Secondary | ICD-10-CM | POA: Diagnosis not present

## 2017-08-19 DIAGNOSIS — L570 Actinic keratosis: Secondary | ICD-10-CM | POA: Diagnosis not present

## 2017-08-19 DIAGNOSIS — D229 Melanocytic nevi, unspecified: Secondary | ICD-10-CM | POA: Diagnosis not present

## 2017-08-19 DIAGNOSIS — D0439 Carcinoma in situ of skin of other parts of face: Secondary | ICD-10-CM | POA: Diagnosis not present

## 2017-08-19 HISTORY — PX: SKIN BIOPSY: SHX1

## 2017-10-09 DIAGNOSIS — D0439 Carcinoma in situ of skin of other parts of face: Secondary | ICD-10-CM | POA: Diagnosis not present

## 2017-12-01 DIAGNOSIS — Z125 Encounter for screening for malignant neoplasm of prostate: Secondary | ICD-10-CM | POA: Diagnosis not present

## 2017-12-01 DIAGNOSIS — R82998 Other abnormal findings in urine: Secondary | ICD-10-CM | POA: Diagnosis not present

## 2017-12-01 DIAGNOSIS — E038 Other specified hypothyroidism: Secondary | ICD-10-CM | POA: Diagnosis not present

## 2017-12-01 DIAGNOSIS — I10 Essential (primary) hypertension: Secondary | ICD-10-CM | POA: Diagnosis not present

## 2017-12-04 DIAGNOSIS — Z0000 Encounter for general adult medical examination without abnormal findings: Secondary | ICD-10-CM | POA: Diagnosis not present

## 2017-12-04 DIAGNOSIS — K219 Gastro-esophageal reflux disease without esophagitis: Secondary | ICD-10-CM | POA: Diagnosis not present

## 2017-12-04 DIAGNOSIS — D638 Anemia in other chronic diseases classified elsewhere: Secondary | ICD-10-CM | POA: Diagnosis not present

## 2017-12-04 DIAGNOSIS — N183 Chronic kidney disease, stage 3 (moderate): Secondary | ICD-10-CM | POA: Diagnosis not present

## 2017-12-04 DIAGNOSIS — N401 Enlarged prostate with lower urinary tract symptoms: Secondary | ICD-10-CM | POA: Diagnosis not present

## 2017-12-04 DIAGNOSIS — I129 Hypertensive chronic kidney disease with stage 1 through stage 4 chronic kidney disease, or unspecified chronic kidney disease: Secondary | ICD-10-CM | POA: Diagnosis not present

## 2017-12-04 DIAGNOSIS — I10 Essential (primary) hypertension: Secondary | ICD-10-CM | POA: Diagnosis not present

## 2017-12-04 DIAGNOSIS — E038 Other specified hypothyroidism: Secondary | ICD-10-CM | POA: Diagnosis not present

## 2017-12-04 DIAGNOSIS — E7849 Other hyperlipidemia: Secondary | ICD-10-CM | POA: Diagnosis not present

## 2017-12-08 DIAGNOSIS — Z1212 Encounter for screening for malignant neoplasm of rectum: Secondary | ICD-10-CM | POA: Diagnosis not present

## 2018-06-10 DIAGNOSIS — D638 Anemia in other chronic diseases classified elsewhere: Secondary | ICD-10-CM | POA: Diagnosis not present

## 2018-06-10 DIAGNOSIS — N183 Chronic kidney disease, stage 3 (moderate): Secondary | ICD-10-CM | POA: Diagnosis not present

## 2018-06-10 DIAGNOSIS — I10 Essential (primary) hypertension: Secondary | ICD-10-CM | POA: Diagnosis not present

## 2018-06-10 DIAGNOSIS — H8110 Benign paroxysmal vertigo, unspecified ear: Secondary | ICD-10-CM | POA: Diagnosis not present

## 2018-06-10 DIAGNOSIS — N401 Enlarged prostate with lower urinary tract symptoms: Secondary | ICD-10-CM | POA: Diagnosis not present

## 2018-06-10 DIAGNOSIS — K219 Gastro-esophageal reflux disease without esophagitis: Secondary | ICD-10-CM | POA: Diagnosis not present

## 2018-06-10 DIAGNOSIS — I129 Hypertensive chronic kidney disease with stage 1 through stage 4 chronic kidney disease, or unspecified chronic kidney disease: Secondary | ICD-10-CM | POA: Diagnosis not present

## 2018-06-10 DIAGNOSIS — E038 Other specified hypothyroidism: Secondary | ICD-10-CM | POA: Diagnosis not present

## 2018-06-10 DIAGNOSIS — Z23 Encounter for immunization: Secondary | ICD-10-CM | POA: Diagnosis not present

## 2018-06-10 DIAGNOSIS — E7849 Other hyperlipidemia: Secondary | ICD-10-CM | POA: Diagnosis not present

## 2018-08-21 DIAGNOSIS — H2513 Age-related nuclear cataract, bilateral: Secondary | ICD-10-CM | POA: Diagnosis not present

## 2018-08-21 DIAGNOSIS — H52203 Unspecified astigmatism, bilateral: Secondary | ICD-10-CM | POA: Diagnosis not present

## 2018-08-21 DIAGNOSIS — H43813 Vitreous degeneration, bilateral: Secondary | ICD-10-CM | POA: Diagnosis not present

## 2018-08-21 DIAGNOSIS — H524 Presbyopia: Secondary | ICD-10-CM | POA: Diagnosis not present

## 2018-11-30 DIAGNOSIS — I10 Essential (primary) hypertension: Secondary | ICD-10-CM | POA: Diagnosis not present

## 2018-11-30 DIAGNOSIS — E038 Other specified hypothyroidism: Secondary | ICD-10-CM | POA: Diagnosis not present

## 2018-11-30 DIAGNOSIS — R82998 Other abnormal findings in urine: Secondary | ICD-10-CM | POA: Diagnosis not present

## 2018-11-30 DIAGNOSIS — E7849 Other hyperlipidemia: Secondary | ICD-10-CM | POA: Diagnosis not present

## 2018-11-30 DIAGNOSIS — Z125 Encounter for screening for malignant neoplasm of prostate: Secondary | ICD-10-CM | POA: Diagnosis not present

## 2018-12-07 DIAGNOSIS — I129 Hypertensive chronic kidney disease with stage 1 through stage 4 chronic kidney disease, or unspecified chronic kidney disease: Secondary | ICD-10-CM | POA: Diagnosis not present

## 2018-12-07 DIAGNOSIS — E039 Hypothyroidism, unspecified: Secondary | ICD-10-CM | POA: Diagnosis not present

## 2018-12-07 DIAGNOSIS — K219 Gastro-esophageal reflux disease without esophagitis: Secondary | ICD-10-CM | POA: Diagnosis not present

## 2018-12-07 DIAGNOSIS — E785 Hyperlipidemia, unspecified: Secondary | ICD-10-CM | POA: Diagnosis not present

## 2018-12-07 DIAGNOSIS — N401 Enlarged prostate with lower urinary tract symptoms: Secondary | ICD-10-CM | POA: Diagnosis not present

## 2018-12-07 DIAGNOSIS — I10 Essential (primary) hypertension: Secondary | ICD-10-CM | POA: Diagnosis not present

## 2018-12-07 DIAGNOSIS — Z1331 Encounter for screening for depression: Secondary | ICD-10-CM | POA: Diagnosis not present

## 2018-12-07 DIAGNOSIS — Z0000 Encounter for general adult medical examination without abnormal findings: Secondary | ICD-10-CM | POA: Diagnosis not present

## 2018-12-07 DIAGNOSIS — N183 Chronic kidney disease, stage 3 (moderate): Secondary | ICD-10-CM | POA: Diagnosis not present

## 2018-12-07 DIAGNOSIS — D638 Anemia in other chronic diseases classified elsewhere: Secondary | ICD-10-CM | POA: Diagnosis not present

## 2018-12-07 DIAGNOSIS — Z1339 Encounter for screening examination for other mental health and behavioral disorders: Secondary | ICD-10-CM | POA: Diagnosis not present

## 2019-06-28 DIAGNOSIS — N1831 Chronic kidney disease, stage 3a: Secondary | ICD-10-CM | POA: Diagnosis not present

## 2019-06-28 DIAGNOSIS — E039 Hypothyroidism, unspecified: Secondary | ICD-10-CM | POA: Diagnosis not present

## 2019-06-28 DIAGNOSIS — D638 Anemia in other chronic diseases classified elsewhere: Secondary | ICD-10-CM | POA: Diagnosis not present

## 2019-06-28 DIAGNOSIS — I129 Hypertensive chronic kidney disease with stage 1 through stage 4 chronic kidney disease, or unspecified chronic kidney disease: Secondary | ICD-10-CM | POA: Diagnosis not present

## 2019-06-28 DIAGNOSIS — E785 Hyperlipidemia, unspecified: Secondary | ICD-10-CM | POA: Diagnosis not present

## 2019-07-07 DIAGNOSIS — M79672 Pain in left foot: Secondary | ICD-10-CM | POA: Diagnosis not present

## 2019-08-16 DIAGNOSIS — M79672 Pain in left foot: Secondary | ICD-10-CM | POA: Diagnosis not present

## 2019-08-23 DIAGNOSIS — L905 Scar conditions and fibrosis of skin: Secondary | ICD-10-CM | POA: Diagnosis not present

## 2019-08-23 DIAGNOSIS — C44311 Basal cell carcinoma of skin of nose: Secondary | ICD-10-CM | POA: Diagnosis not present

## 2019-08-23 DIAGNOSIS — L570 Actinic keratosis: Secondary | ICD-10-CM | POA: Diagnosis not present

## 2019-08-23 DIAGNOSIS — L814 Other melanin hyperpigmentation: Secondary | ICD-10-CM | POA: Diagnosis not present

## 2019-08-23 DIAGNOSIS — C44519 Basal cell carcinoma of skin of other part of trunk: Secondary | ICD-10-CM | POA: Diagnosis not present

## 2019-08-23 DIAGNOSIS — L820 Inflamed seborrheic keratosis: Secondary | ICD-10-CM | POA: Diagnosis not present

## 2019-08-23 DIAGNOSIS — D2261 Melanocytic nevi of right upper limb, including shoulder: Secondary | ICD-10-CM | POA: Diagnosis not present

## 2019-08-23 DIAGNOSIS — D225 Melanocytic nevi of trunk: Secondary | ICD-10-CM | POA: Diagnosis not present

## 2019-08-23 DIAGNOSIS — L821 Other seborrheic keratosis: Secondary | ICD-10-CM | POA: Diagnosis not present

## 2019-08-23 DIAGNOSIS — L853 Xerosis cutis: Secondary | ICD-10-CM | POA: Diagnosis not present

## 2019-08-23 DIAGNOSIS — D1801 Hemangioma of skin and subcutaneous tissue: Secondary | ICD-10-CM | POA: Diagnosis not present

## 2019-08-31 DIAGNOSIS — H524 Presbyopia: Secondary | ICD-10-CM | POA: Diagnosis not present

## 2019-08-31 DIAGNOSIS — H2513 Age-related nuclear cataract, bilateral: Secondary | ICD-10-CM | POA: Diagnosis not present

## 2019-08-31 DIAGNOSIS — H52203 Unspecified astigmatism, bilateral: Secondary | ICD-10-CM | POA: Diagnosis not present

## 2019-08-31 DIAGNOSIS — H5213 Myopia, bilateral: Secondary | ICD-10-CM | POA: Diagnosis not present

## 2019-10-18 DIAGNOSIS — Z85828 Personal history of other malignant neoplasm of skin: Secondary | ICD-10-CM | POA: Diagnosis not present

## 2019-10-18 DIAGNOSIS — C44311 Basal cell carcinoma of skin of nose: Secondary | ICD-10-CM | POA: Diagnosis not present

## 2019-12-23 DIAGNOSIS — Z0000 Encounter for general adult medical examination without abnormal findings: Secondary | ICD-10-CM | POA: Diagnosis not present

## 2019-12-23 DIAGNOSIS — E7849 Other hyperlipidemia: Secondary | ICD-10-CM | POA: Diagnosis not present

## 2019-12-23 DIAGNOSIS — E038 Other specified hypothyroidism: Secondary | ICD-10-CM | POA: Diagnosis not present

## 2019-12-23 DIAGNOSIS — I10 Essential (primary) hypertension: Secondary | ICD-10-CM | POA: Diagnosis not present

## 2019-12-30 DIAGNOSIS — E785 Hyperlipidemia, unspecified: Secondary | ICD-10-CM | POA: Diagnosis not present

## 2019-12-30 DIAGNOSIS — R82998 Other abnormal findings in urine: Secondary | ICD-10-CM | POA: Diagnosis not present

## 2019-12-30 DIAGNOSIS — Z1212 Encounter for screening for malignant neoplasm of rectum: Secondary | ICD-10-CM | POA: Diagnosis not present

## 2019-12-30 DIAGNOSIS — Z125 Encounter for screening for malignant neoplasm of prostate: Secondary | ICD-10-CM | POA: Diagnosis not present

## 2019-12-30 DIAGNOSIS — D638 Anemia in other chronic diseases classified elsewhere: Secondary | ICD-10-CM | POA: Diagnosis not present

## 2019-12-30 DIAGNOSIS — Z0000 Encounter for general adult medical examination without abnormal findings: Secondary | ICD-10-CM | POA: Diagnosis not present

## 2019-12-30 DIAGNOSIS — E039 Hypothyroidism, unspecified: Secondary | ICD-10-CM | POA: Diagnosis not present

## 2019-12-30 DIAGNOSIS — N1831 Chronic kidney disease, stage 3a: Secondary | ICD-10-CM | POA: Diagnosis not present

## 2019-12-30 DIAGNOSIS — I129 Hypertensive chronic kidney disease with stage 1 through stage 4 chronic kidney disease, or unspecified chronic kidney disease: Secondary | ICD-10-CM | POA: Diagnosis not present

## 2019-12-30 DIAGNOSIS — K219 Gastro-esophageal reflux disease without esophagitis: Secondary | ICD-10-CM | POA: Diagnosis not present

## 2019-12-30 DIAGNOSIS — I10 Essential (primary) hypertension: Secondary | ICD-10-CM | POA: Diagnosis not present

## 2019-12-30 DIAGNOSIS — N401 Enlarged prostate with lower urinary tract symptoms: Secondary | ICD-10-CM | POA: Diagnosis not present

## 2019-12-31 DIAGNOSIS — Z125 Encounter for screening for malignant neoplasm of prostate: Secondary | ICD-10-CM | POA: Diagnosis not present

## 2020-06-30 ENCOUNTER — Ambulatory Visit: Payer: Medicare Other | Attending: Internal Medicine

## 2020-06-30 DIAGNOSIS — Z23 Encounter for immunization: Secondary | ICD-10-CM

## 2020-06-30 NOTE — Progress Notes (Signed)
   Covid-19 Vaccination Clinic  Name:  KEMONTE ULLMAN    MRN: [id number] DOB: 13-Mar-1951  06/30/2020  Mr. Kamau was observed post Covid-19 immunization for 15 minutes without incident. He was provided with Vaccine Information Sheet and instruction to access the V-Safe system.   Mr. Labella was instructed to call 911 with any severe reactions post vaccine: Marland Kitchen Difficulty breathing  . Swelling of face and throat  . A fast heartbeat  . A bad rash all over body  . Dizziness and weakness   Immunizations Administered    Name Date Dose VIS Date Route   Pfizer COVID-19 Vaccine 06/30/2020  2:39 PM 0.3 mL 04/12/2020 Intramuscular   Manufacturer: [place]   Lot: [id number]   [place]: [id number]

## 2020-08-22 DIAGNOSIS — L814 Other melanin hyperpigmentation: Secondary | ICD-10-CM | POA: Diagnosis not present

## 2020-08-22 DIAGNOSIS — L821 Other seborrheic keratosis: Secondary | ICD-10-CM | POA: Diagnosis not present

## 2020-08-22 DIAGNOSIS — Z85828 Personal history of other malignant neoplasm of skin: Secondary | ICD-10-CM | POA: Diagnosis not present

## 2020-08-22 DIAGNOSIS — D225 Melanocytic nevi of trunk: Secondary | ICD-10-CM | POA: Diagnosis not present

## 2020-08-22 DIAGNOSIS — C44212 Basal cell carcinoma of skin of right ear and external auricular canal: Secondary | ICD-10-CM | POA: Diagnosis not present

## 2020-08-25 DIAGNOSIS — H6123 Impacted cerumen, bilateral: Secondary | ICD-10-CM | POA: Diagnosis not present

## 2020-09-05 DIAGNOSIS — H524 Presbyopia: Secondary | ICD-10-CM | POA: Diagnosis not present

## 2021-01-22 DIAGNOSIS — Z125 Encounter for screening for malignant neoplasm of prostate: Secondary | ICD-10-CM | POA: Diagnosis not present

## 2021-01-22 DIAGNOSIS — E039 Hypothyroidism, unspecified: Secondary | ICD-10-CM | POA: Diagnosis not present

## 2021-01-22 DIAGNOSIS — E785 Hyperlipidemia, unspecified: Secondary | ICD-10-CM | POA: Diagnosis not present

## 2021-01-24 DIAGNOSIS — N183 Chronic kidney disease, stage 3 unspecified: Secondary | ICD-10-CM | POA: Diagnosis not present

## 2021-01-24 DIAGNOSIS — R8290 Unspecified abnormal findings in urine: Secondary | ICD-10-CM | POA: Diagnosis not present

## 2021-01-24 DIAGNOSIS — N1831 Chronic kidney disease, stage 3a: Secondary | ICD-10-CM | POA: Diagnosis not present

## 2021-01-24 DIAGNOSIS — Z0000 Encounter for general adult medical examination without abnormal findings: Secondary | ICD-10-CM | POA: Diagnosis not present

## 2021-01-24 DIAGNOSIS — E785 Hyperlipidemia, unspecified: Secondary | ICD-10-CM | POA: Diagnosis not present

## 2021-01-24 DIAGNOSIS — R82998 Other abnormal findings in urine: Secondary | ICD-10-CM | POA: Diagnosis not present

## 2021-01-24 DIAGNOSIS — I129 Hypertensive chronic kidney disease with stage 1 through stage 4 chronic kidney disease, or unspecified chronic kidney disease: Secondary | ICD-10-CM | POA: Diagnosis not present

## 2021-03-27 DIAGNOSIS — R079 Chest pain, unspecified: Secondary | ICD-10-CM | POA: Diagnosis not present

## 2021-03-27 DIAGNOSIS — R0602 Shortness of breath: Secondary | ICD-10-CM | POA: Diagnosis not present

## 2021-03-27 DIAGNOSIS — I10 Essential (primary) hypertension: Secondary | ICD-10-CM | POA: Diagnosis not present

## 2021-03-27 DIAGNOSIS — E785 Hyperlipidemia, unspecified: Secondary | ICD-10-CM | POA: Diagnosis not present

## 2021-03-30 ENCOUNTER — Other Ambulatory Visit: Payer: Self-pay

## 2021-03-30 ENCOUNTER — Ambulatory Visit: Payer: Medicare Other | Admitting: Cardiology

## 2021-03-30 ENCOUNTER — Encounter: Payer: Self-pay | Admitting: Cardiology

## 2021-03-30 VITALS — BP 147/87 | HR 52 | Temp 98.0°F | Resp 16 | Ht 72.0 in | Wt 186.0 lb

## 2021-03-30 DIAGNOSIS — I209 Angina pectoris, unspecified: Secondary | ICD-10-CM

## 2021-03-30 DIAGNOSIS — E782 Mixed hyperlipidemia: Secondary | ICD-10-CM

## 2021-03-30 DIAGNOSIS — I10 Essential (primary) hypertension: Secondary | ICD-10-CM

## 2021-03-30 DIAGNOSIS — R001 Bradycardia, unspecified: Secondary | ICD-10-CM | POA: Diagnosis not present

## 2021-03-30 MED ORDER — ISOSORBIDE MONONITRATE ER 30 MG PO TB24: 30.0000 mg | ORAL_TABLET | Freq: Every day | ORAL | 3 refills | Status: DC

## 2021-03-30 MED ORDER — NITROGLYCERIN 0.4 MG SL SUBL: 0.4000 mg | SUBLINGUAL_TABLET | SUBLINGUAL | 3 refills | Status: DC | PRN

## 2021-03-30 MED ORDER — ASPIRIN EC 81 MG PO TBEC: 81.0000 mg | DELAYED_RELEASE_TABLET | Freq: Every day | ORAL | 3 refills | Status: AC

## 2021-03-30 MED ORDER — PANTOPRAZOLE SODIUM 40 MG PO TBEC: 40.0000 mg | DELAYED_RELEASE_TABLET | Freq: Every day | ORAL | 3 refills | Status: DC

## 2021-03-30 NOTE — Progress Notes (Signed)
Patient referred by Burnard Bunting, MD for chest pain  Subjective:   Cristian Cantrell, male    DOB: 04-21-1951, 70 y.o.   MRN: [id number]   Chief Complaint  Patient presents with   Chest Pain   Bradycardia   DOE   New Patient (Initial Visit)     HPI  70 y.o. Caucasian male with hypertension, hyperlipidemia, remote h/o gastric ulcer, now with exertional chest pain and dyspnea  Patient lives in [place] with his wife. 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 For last few weeks, has been experiencing retrosternal chest tightness and shortness of breath with most physical activities.  Few days ago, he felt severely symptomatic and dizzy during a brisk walk, symptoms improved after rest.  In addition, he also reports a separate retrosternal burning sensation that is present even at rest.  Overall, he has had remote history of gastric ulcer in 1970s with no recent recurrence.  He also reported hematochezia, melena symptoms.  Hemoglobin has been stable.  He is currently on omeprazole 20 daily.  Blood pressure is elevated today.   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   left nostril - CX3 + 5FU   Syncope    Tinnitus of left ear      Past Surgical History:  Procedure Laterality Date   none     SKIN BIOPSY Left 05/03/2015   left nasal tip   SKIN BIOPSY Left 08/19/2017   left nostril     Social History   Tobacco Use  Smoking Status Never  Smokeless Tobacco Never    Social History   Substance and Sexual Activity  Alcohol Use No   Alcohol/week: 0.0 standard drinks     Family History  Problem Relation Age of Onset   Hypertension Mother    Parkinson's disease Father    Heart murmur Father      Current Outpatient  Medications on File Prior to Visit  Medication Sig Dispense Refill   amLODipine (NORVASC) 5 MG tablet Take 2.5 mg by mouth daily.      BYSTOLIC 10 MG tablet Take 5 mg by mouth daily.      cholecalciferol (VITAMIN D) 1000 UNITS tablet Take 1,000 Units by mouth daily.     levothyroxine (SYNTHROID, LEVOTHROID) 50 MCG tablet Take 50 mcg by mouth daily.      losartan (COZAAR) 100 MG tablet Take 100 mg by mouth daily.     omeprazole (PRILOSEC) 20 MG capsule Take 20 mg by mouth daily.     rosuvastatin (CRESTOR) 10 MG tablet Take 10 mg by mouth daily.     tamsulosin (FLOMAX) 0.4 MG CAPS capsule Take 0.4 mg by mouth daily.     No current facility-administered medications on file prior to visit.    Cardiovascular and other pertinent studies:   EKG 03/30/2021: Sinus rhythm 59 bpm First degree AV block Nonspecific T wave inversion inferior leads  Echocardiogram 2016: - Left ventricle: The cavity size was normal. Systolic function was    normal. The estimated ejection fraction was in the range of 60%    to 65%. Wall motion was normal; there were no regional wall    motion abnormalities. Left ventricular  diastolic function    parameters were normal.  - Aortic valve: Trileaflet; normal thickness leaflets. There was no    regurgitation.  - Aortic root: The aortic root was normal in size.  - Right ventricle: The cavity size was normal. Wall thickness was    normal. Systolic function was normal.  - Tricuspid valve: There was trivial regurgitation.  - Pulmonic valve: There was no regurgitation.  - Pulmonary arteries: Systolic pressure was at the upper limits of    normal. PA peak pressure: 36 mm Hg (S).  - Inferior vena cava: The vessel was normal in size.  - Pericardium, extracardiac: There was no pericardial effusion.   Stress test 2016: This is an intermediate risk study, abnormal. There is a medium defect of mild severity present in the mid anteroseptal, apical anterior and apical septal  location. The defect is partially reversible suggestive of ischemia Significant ST segment depression, horizontal to upsloping seen at peak stress, 2 mm inferolateral leads, promptly resolving 2 minutes in the recovery  Cardiac MRI 2016: 1.  Normal left ventricular size and systolic function, EF 07%. 2. Normal right ventricular size and systolic function. No evidence on this study for ARVC. 3. No myocardial LGE. Therefore, no definitive evidence for prior MI, infiltrative disease, or myocarditis. From this study, there is no indication of prior MI but cannot rule out ischemia (Cardiolite reviewed, suggested MI with peri-infarct ischemia).     Recent labs: 03/27/2021: Glucose 97, BUN/Cr 16/1.4. EGFR 49. Na/K 138/4.3. Rest of the CMP normal H/H 14/42. MCV 91. Platelets 174 HbA1C N/A Chol 196, TG 137, HDL 32, LDL 137 TSH 3.1 normal    Review of Systems  Cardiovascular:  Positive for chest pain and dyspnea on exertion. Negative for leg swelling, palpitations and syncope.  Gastrointestinal:  Positive for heartburn.        Vitals:   03/30/21 1038  BP: (!) 147/87  Pulse: (!) 52  Resp: 16  Temp: 98 F (36.7 C)  SpO2: 100%     Body mass index is 25.23 kg/m. Filed Weights   03/30/21 1038  Weight: 186 lb (84.4 kg)     Objective:   Physical Exam Vitals and nursing note reviewed.  Constitutional:      General: He is not in acute distress. Neck:     Vascular: No JVD.  Cardiovascular:     Rate and Rhythm: Normal rate and regular rhythm.     Heart sounds: Normal heart sounds. No murmur heard. Pulmonary:     Effort: Pulmonary effort is normal.     Breath sounds: Normal breath sounds. No wheezing or rales.  Musculoskeletal:     Right lower leg: No edema.     Left lower leg: No edema.           Assessment & Recommendations:   70 y.o. Caucasian male with hypertension, hyperlipidemia, remote h/o gastric ulcer, now with exertional chest pain and dyspnea  Angina  pectoris: Exertional chest pain and dyspnea will consult anginal symptoms.  Separately, he also has resting retrosternal burning sensation.  I suspect this may be a separate GERD symptoms.  Change omeprazole to pantoprazole 40 mg daily.  Recommend exercise nuclear stress test and echocardiogram.  Continue aspirin, statin.  Added Imdur 30 mg daily.  Continue Bystolic.  Sublingual nitroglycerin as needed.  Increase Crestor to 20 mg daily.  Hypertension: Uncontrolled.  Hopefully, addition of Imdur will help.  Further recommendations after above testing  Thank you for referring the patient to  [place]. Please feel free to contact with any questions.   Nigel Mormon, MD Pager: [phone] Office: [phone]

## 2021-04-16 ENCOUNTER — Ambulatory Visit: Payer: Medicare Other

## 2021-04-16 ENCOUNTER — Other Ambulatory Visit: Payer: Self-pay

## 2021-04-16 DIAGNOSIS — I209 Angina pectoris, unspecified: Secondary | ICD-10-CM

## 2021-04-17 LAB — PCV MYOCARDIAL PERFUSION WO LEXISCAN
Angina Index: 0
ST Depression (mm): 2 mm

## 2021-04-27 ENCOUNTER — Ambulatory Visit: Payer: Medicare Other | Admitting: Cardiology

## 2021-04-27 ENCOUNTER — Other Ambulatory Visit: Payer: Self-pay

## 2021-04-27 ENCOUNTER — Encounter: Payer: Self-pay | Admitting: Cardiology

## 2021-04-27 VITALS — BP 131/81 | HR 53 | Resp 16 | Ht 72.0 in | Wt 185.5 lb

## 2021-04-27 DIAGNOSIS — R9439 Abnormal result of other cardiovascular function study: Secondary | ICD-10-CM | POA: Insufficient documentation

## 2021-04-27 DIAGNOSIS — N1831 Chronic kidney disease, stage 3a: Secondary | ICD-10-CM

## 2021-04-27 DIAGNOSIS — I209 Angina pectoris, unspecified: Secondary | ICD-10-CM

## 2021-04-27 DIAGNOSIS — I129 Hypertensive chronic kidney disease with stage 1 through stage 4 chronic kidney disease, or unspecified chronic kidney disease: Secondary | ICD-10-CM | POA: Diagnosis not present

## 2021-04-27 DIAGNOSIS — E782 Mixed hyperlipidemia: Secondary | ICD-10-CM | POA: Diagnosis not present

## 2021-04-27 DIAGNOSIS — I10 Essential (primary) hypertension: Secondary | ICD-10-CM

## 2021-04-27 DIAGNOSIS — N179 Acute kidney failure, unspecified: Secondary | ICD-10-CM

## 2021-04-27 NOTE — Progress Notes (Signed)
Patient referred by Burnard Bunting, MD for chest pain  Subjective:   Cristian Cantrell, male    DOB: April 24, 1951, 70 y.o.   MRN: [id number]   Chief Complaint  Patient presents with   Hypertension   Results   Follow-up     HPI  70 y.o. Caucasian male with hypertension, hyperlipidemia, remote h/o gastric ulcer, now with exertional chest pain and dyspnea  Patient symptoms of exertional chest pain significantly improved since starting Imdur 30 mg.  However, he continues to have chest pain frequently.  His last episode was this morning, on before he had was last night during sexual intercourse.  Discussed recent test results with the patient, details below.  Surprisingly, he did not have chest pain during exercise on treadmill, but did have significant abnormalities on stress testing as detailed below.  Initial consultation HPI 03/2021: Patient lives in pleasant [place] with his wife. 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 For last few weeks, has been experiencing retrosternal chest tightness and shortness of breath with most physical activities.  Few days ago, he felt severely symptomatic and dizzy during a brisk walk, symptoms improved after rest.  In addition, he also reports a separate retrosternal burning sensation that is present even at rest.  Overall, he has had remote history of gastric ulcer in 1970s with no recent recurrence.  He denies hematochezia, melena symptoms.  Hemoglobin has been stable.  He is currently on omeprazole 20 daily.  Blood pressure is elevated today.    Current Outpatient Medications on File Prior to Visit  Medication Sig Dispense Refill   amLODipine (NORVASC) 5 MG tablet Take 2.5 mg by mouth daily.      aspirin EC 81 MG tablet Take 1 tablet (81 mg total) by mouth daily. 30 tablet 3   BYSTOLIC 10 MG tablet Take 5 mg by mouth daily.      cholecalciferol (VITAMIN D) 1000 UNITS tablet Take 1,000 Units by mouth daily.     isosorbide mononitrate (IMDUR) 30 MG 24  hr tablet Take 1 tablet (30 mg total) by mouth daily. 30 tablet 3   levothyroxine (SYNTHROID, LEVOTHROID) 50 MCG tablet Take 50 mcg by mouth daily.      losartan (COZAAR) 100 MG tablet Take 100 mg by mouth daily.     nitroGLYCERIN (NITROSTAT) 0.4 MG SL tablet Place 1 tablet (0.4 mg total) under the tongue every 5 (five) minutes as needed for chest pain. 30 tablet 3   pantoprazole (PROTONIX) 40 MG tablet Take 1 tablet (40 mg total) by mouth daily. 30 tablet 3   rosuvastatin (CRESTOR) 10 MG tablet Take 20 mg by mouth daily.     tamsulosin (FLOMAX) 0.4 MG CAPS capsule Take 0.4 mg by mouth daily.     No current facility-administered medications on file prior to visit.    Cardiovascular and other pertinent studies:  Echocardiogram 04/16/2021:  Left ventricle cavity is normal in size and wall thickness. Normal global  wall motion. Normal LV systolic function with EF 61%. Normal diastolic  filling pattern.  Left atrial cavity is mildly dilated.  Structurally normal trileaflet aortic valve. Trace aortic regurgitation.  Mild pulmonic regurgitation.  Normal right atrial pressure.   Exercise Tetrofosmin stress test 04/16/2021: Exercise nuclear stress test was performed using Bruce protocol. Patient reached 10.1 METS, and 98% of age predicted maximum heart rate. Exercise capacity was good. No chest pain reported, dyspnea reported. Heart rate and hemodynamic response were normal. Stress EKG showed sinus tachycardia,  frequent PVC's, 1.5-2 mm horizontal ST depression in leads V5, V6 persisting beyond 2 min into recovery.  SPECT images show medium sized, medium intensity, partially reversible perfusion defect in apical to basal, inferior/inferoseptal myocardium. Stress LVEF 50%. Intermediate risk study.   EKG 03/30/2021: Sinus rhythm 59 bpm First degree AV block Nonspecific T wave inversion inferior leads  Echocardiogram 2016: - Left ventricle: The cavity size was normal. Systolic function was     normal. The estimated ejection fraction was in the range of 60%    to 65%. Wall motion was normal; there were no regional wall    motion abnormalities. Left ventricular diastolic function    parameters were normal.  - Aortic valve: Trileaflet; normal thickness leaflets. There was no    regurgitation.  - Aortic root: The aortic root was normal in size.  - Right ventricle: The cavity size was normal. Wall thickness was    normal. Systolic function was normal.  - Tricuspid valve: There was trivial regurgitation.  - Pulmonic valve: There was no regurgitation.  - Pulmonary arteries: Systolic pressure was at the upper limits of    normal. PA peak pressure: 36 mm Hg (S).  - Inferior vena cava: The vessel was normal in size.  - Pericardium, extracardiac: There was no pericardial effusion.   Stress test 2016: This is an intermediate risk study, abnormal. There is a medium defect of mild severity present in the mid anteroseptal, apical anterior and apical septal location. The defect is partially reversible suggestive of ischemia Significant ST segment depression, horizontal to upsloping seen at peak stress, 2 mm inferolateral leads, promptly resolving 2 minutes in the recovery  Cardiac MRI 2016: 1.  Normal left ventricular size and systolic function, EF 42%. 2. Normal right ventricular size and systolic function. No evidence on this study for ARVC. 3. No myocardial LGE. Therefore, no definitive evidence for prior MI, infiltrative disease, or myocarditis. From this study, there is no indication of prior MI but cannot rule out ischemia (Cardiolite reviewed, suggested MI with peri-infarct ischemia).     Recent labs: 03/27/2021: Glucose 97, BUN/Cr 16/1.4. EGFR 49. Na/K 138/4.3. Rest of the CMP normal H/H 14/42. MCV 91. Platelets 174 HbA1C N/A Chol 196, TG 137, HDL 32, LDL 137 TSH 3.1 normal    Review of Systems  Cardiovascular:  Positive for chest pain and dyspnea on exertion. Negative  for leg swelling, palpitations and syncope.  Gastrointestinal:  Positive for heartburn.        Vitals:   04/27/21 1040  BP: 131/81  Pulse: (!) 53  Resp: 16  SpO2: 97%     Body mass index is 25.16 kg/m. Filed Weights   04/27/21 1040  Weight: 185 lb 8 oz (84.1 kg)     Objective:   Physical Exam Vitals and nursing note reviewed.  Constitutional:      General: He is not in acute distress. Neck:     Vascular: No JVD.  Cardiovascular:     Rate and Rhythm: Normal rate and regular rhythm.     Heart sounds: Normal heart sounds. No murmur heard. Pulmonary:     Effort: Pulmonary effort is normal.     Breath sounds: Normal breath sounds. No wheezing or rales.  Musculoskeletal:     Right lower leg: No edema.     Left lower leg: No edema.           Assessment & Recommendations:   70 y.o. Caucasian male with hypertension, hyperlipidemia, remote h/o gastric ulcer,  now with exertional chest pain and dyspnea  Angina pectoris: Exertional chest pain and dyspnea concerning for angina. Patient has improved, but still has frequent angina symptoms in spite of optimal medical therapy.  Stress EKG at 10 METS showed sinus tachycardia, frequent PVC's, 1.5-2 mm horizontal ST depression in leads V5, V6 persisting beyond 2 min into recovery.  SPECT images show medium sized, medium intensity, partially reversible perfusion defect in apical to basal, inferior/inferoseptal myocardium. Stress LVEF 50%. Continue aspirin, statin, Imdur, Bystolic  Recommend coronary angiography and possible intervention for symptom improvement Schedule for cardiac catheterization, and possible angioplasty. We discussed regarding risks, benefits, alternatives to this including stress testing, CTA and continued medical therapy. Patient wants to proceed. Understands <1-2% risk of death, stroke, MI, urgent CABG, bleeding, infection, renal failure but not limited to these.   Will check labs. He may may need  additional hydration than usual, if GFR<50.  Hypertension: Better controlled  Further recommendations after above testing  Thank you for referring the patient to [place]. Please feel free to contact with any questions.   Nigel Mormon, MD Pager: [phone] Office: [phone]

## 2021-05-08 DIAGNOSIS — I209 Angina pectoris, unspecified: Secondary | ICD-10-CM | POA: Diagnosis not present

## 2021-05-09 ENCOUNTER — Ambulatory Visit: Payer: Medicare Other | Admitting: Cardiology

## 2021-05-09 LAB — CBC
Hematocrit: 43.3 % (ref 37.5–51.0)
Hemoglobin: 15.1 g/dL (ref 13.0–17.7)
MCH: 31.7 pg (ref 26.6–33.0)
MCHC: 34.9 g/dL (ref 31.5–35.7)
MCV: 91 fL (ref 79–97)
Platelets: 169 10*3/uL (ref 150–450)
RBC: 4.77 x10E6/uL (ref 4.14–5.80)
RDW: 12 % (ref 11.6–15.4)
WBC: 6.2 10*3/uL (ref 3.4–10.8)

## 2021-05-09 LAB — LIPID PANEL
Chol/HDL Ratio: 3.4 ratio (ref 0.0–5.0)
Cholesterol, Total: 125 mg/dL (ref 100–199)
HDL: 37 mg/dL — ABNORMAL LOW (ref >39)
LDL Chol Calc (NIH): 73 mg/dL (ref 0–99)
Triglycerides: 74 mg/dL (ref 0–149)
VLDL Cholesterol Cal: 15 mg/dL (ref 5–40)

## 2021-05-09 LAB — BASIC METABOLIC PANEL
BUN/Creatinine Ratio: 9 — ABNORMAL LOW (ref 10–24)
BUN: 13 mg/dL (ref 8–27)
CO2: 25 mmol/L (ref 20–29)
Calcium: 9.6 mg/dL (ref 8.6–10.2)
Chloride: 103 mmol/L (ref 96–106)
Creatinine, Ser: 1.47 mg/dL — ABNORMAL HIGH (ref 0.76–1.27)
Glucose: 96 mg/dL (ref 70–99)
Potassium: 3.9 mmol/L (ref 3.5–5.2)
Sodium: 139 mmol/L (ref 134–144)
eGFR: 50 mL/min/{1.73_m2} — ABNORMAL LOW (ref >59)

## 2021-05-09 NOTE — Progress Notes (Signed)
Moderate CKD. Will need additional hydration for 2 more hours before cath.  Thanks MJP

## 2021-05-10 NOTE — Progress Notes (Signed)
What you said is perfect.   Thanks MJP

## 2021-05-15 ENCOUNTER — Other Ambulatory Visit: Payer: Self-pay

## 2021-05-15 ENCOUNTER — Ambulatory Visit (HOSPITAL_COMMUNITY)
Admission: RE | Admit: 2021-05-15 | Discharge: 2021-05-15 | Disposition: A | Discharge status: Home / Self Care | Payer: Medicare Other | Attending: Cardiology | Admitting: Cardiology

## 2021-05-15 ENCOUNTER — Other Ambulatory Visit: Payer: Self-pay | Admitting: Cardiology

## 2021-05-15 ENCOUNTER — Ambulatory Visit (HOSPITAL_COMMUNITY)
Admission: RE | Disposition: A | Discharge status: Home / Self Care | Payer: Self-pay | Source: Home / Self Care | Attending: Cardiology

## 2021-05-15 DIAGNOSIS — R0600 Dyspnea, unspecified: Secondary | ICD-10-CM | POA: Diagnosis not present

## 2021-05-15 DIAGNOSIS — R9439 Abnormal result of other cardiovascular function study: Secondary | ICD-10-CM | POA: Diagnosis not present

## 2021-05-15 DIAGNOSIS — I25119 Atherosclerotic heart disease of native coronary artery with unspecified angina pectoris: Secondary | ICD-10-CM | POA: Diagnosis not present

## 2021-05-15 DIAGNOSIS — I10 Essential (primary) hypertension: Secondary | ICD-10-CM | POA: Diagnosis not present

## 2021-05-15 DIAGNOSIS — I209 Angina pectoris, unspecified: Secondary | ICD-10-CM

## 2021-05-15 DIAGNOSIS — I25118 Atherosclerotic heart disease of native coronary artery with other forms of angina pectoris: Secondary | ICD-10-CM

## 2021-05-15 DIAGNOSIS — E785 Hyperlipidemia, unspecified: Secondary | ICD-10-CM | POA: Insufficient documentation

## 2021-05-15 HISTORY — PX: LEFT HEART CATH AND CORONARY ANGIOGRAPHY: CATH118249

## 2021-05-15 SURGERY — LEFT HEART CATH AND CORONARY ANGIOGRAPHY
Anesthesia: LOCAL

## 2021-05-15 MED ORDER — SODIUM CHLORIDE 0.9% FLUSH: 3.0000 mL | Freq: Two times a day (BID) | INTRAVENOUS | Status: DC

## 2021-05-15 MED ORDER — MIDAZOLAM HCL 2 MG/2ML IJ SOLN
INTRAMUSCULAR | Status: DC | PRN
  Administered 2021-05-15: 1 mg via INTRAVENOUS

## 2021-05-15 MED ORDER — HYDRALAZINE HCL 20 MG/ML IJ SOLN: 10.0000 mg | INTRAMUSCULAR | Status: DC | PRN

## 2021-05-15 MED ORDER — LIDOCAINE HCL (PF) 1 % IJ SOLN
INTRAMUSCULAR | Status: AC
  Filled 2021-05-15: qty 30

## 2021-05-15 MED ORDER — SODIUM CHLORIDE 0.9 % WEIGHT BASED INFUSION
3.0000 mL/kg/h | INTRAVENOUS | Status: AC
  Administered 2021-05-15: 3 mL/kg/h via INTRAVENOUS

## 2021-05-15 MED ORDER — ROSUVASTATIN CALCIUM 40 MG PO TABS
40.0000 mg | ORAL_TABLET | Freq: Every day | ORAL | 3 refills | Status: DC
Start: 2021-05-15 — End: 2021-08-15

## 2021-05-15 MED ORDER — SODIUM CHLORIDE 0.9 % WEIGHT BASED INFUSION: 1.0000 mL/kg/h | INTRAVENOUS | Status: DC

## 2021-05-15 MED ORDER — SODIUM CHLORIDE 0.9% FLUSH: 3.0000 mL | INTRAVENOUS | Status: DC | PRN

## 2021-05-15 MED ORDER — ASPIRIN 81 MG PO CHEW: 81.0000 mg | CHEWABLE_TABLET | ORAL | Status: DC

## 2021-05-15 MED ORDER — HEPARIN (PORCINE) IN NACL 1000-0.9 UT/500ML-% IV SOLN
INTRAVENOUS | Status: DC | PRN
  Administered 2021-05-15: 500 mL

## 2021-05-15 MED ORDER — HEPARIN (PORCINE) IN NACL 1000-0.9 UT/500ML-% IV SOLN
INTRAVENOUS | Status: AC
  Filled 2021-05-15: qty 1000

## 2021-05-15 MED ORDER — SODIUM CHLORIDE 0.9 % IV SOLN: 250.0000 mL | INTRAVENOUS | Status: DC | PRN

## 2021-05-15 MED ORDER — FENTANYL CITRATE (PF) 100 MCG/2ML IJ SOLN
INTRAMUSCULAR | Status: DC | PRN
  Administered 2021-05-15: 50 ug via INTRAVENOUS

## 2021-05-15 MED ORDER — FENTANYL CITRATE (PF) 100 MCG/2ML IJ SOLN
INTRAMUSCULAR | Status: AC
  Filled 2021-05-15: qty 2

## 2021-05-15 MED ORDER — HEPARIN (PORCINE) IN NACL 2000-0.9 UNIT/L-% IV SOLN
INTRAVENOUS | Status: DC | PRN
  Administered 2021-05-15: 1000 mL

## 2021-05-15 MED ORDER — VERAPAMIL HCL 2.5 MG/ML IV SOLN
INTRAVENOUS | Status: DC | PRN
  Administered 2021-05-15: 10 mL via INTRA_ARTERIAL

## 2021-05-15 MED ORDER — MIDAZOLAM HCL 2 MG/2ML IJ SOLN
INTRAMUSCULAR | Status: AC
  Filled 2021-05-15: qty 2

## 2021-05-15 MED ORDER — HEPARIN SODIUM (PORCINE) 1000 UNIT/ML IJ SOLN
INTRAMUSCULAR | Status: DC | PRN
  Administered 2021-05-15: 4000 [IU] via INTRAVENOUS

## 2021-05-15 MED ORDER — ONDANSETRON HCL 4 MG/2ML IJ SOLN: 4.0000 mg | Freq: Four times a day (QID) | INTRAMUSCULAR | Status: DC | PRN

## 2021-05-15 MED ORDER — ACETAMINOPHEN 325 MG PO TABS: 650.0000 mg | ORAL_TABLET | ORAL | Status: DC | PRN

## 2021-05-15 MED ORDER — SODIUM CHLORIDE 0.9 % IV SOLN: INTRAVENOUS | Status: AC

## 2021-05-15 MED ORDER — VERAPAMIL HCL 2.5 MG/ML IV SOLN
INTRAVENOUS | Status: AC
  Filled 2021-05-15: qty 2

## 2021-05-15 MED ORDER — ISOSORBIDE MONONITRATE ER 30 MG PO TB24: 30.0000 mg | ORAL_TABLET | Freq: Every day | ORAL | 3 refills | Status: DC

## 2021-05-15 MED ORDER — IOHEXOL 350 MG/ML SOLN
INTRAVENOUS | Status: DC | PRN
  Administered 2021-05-15: 30 mL

## 2021-05-15 MED ORDER — HEPARIN SODIUM (PORCINE) 1000 UNIT/ML IJ SOLN
INTRAMUSCULAR | Status: AC
  Filled 2021-05-15: qty 1

## 2021-05-15 MED ORDER — LABETALOL HCL 5 MG/ML IV SOLN: 10.0000 mg | INTRAVENOUS | Status: DC | PRN

## 2021-05-15 SURGICAL SUPPLY — 9 items

## 2021-05-15 NOTE — Interval H&P Note (Signed)
History and Physical Interval Note:  05/15/2021 2:12 PM  Cristian Cantrell  has presented today for surgery, with the diagnosis of chest pain.  The various methods of treatment have been discussed with the patient and family. After consideration of risks, benefits and other options for treatment, the patient has consented to  Procedure(s): LEFT HEART CATH AND CORONARY ANGIOGRAPHY (N/A) as a surgical intervention.  The patient's history has been reviewed, patient examined, no change in status, stable for surgery.  I have reviewed the patient's chart and labs.  Questions were answered to the patient's satisfaction.    2016/2017 Appropriate Use Criteria for Coronary Revascularization Symptom Status: Ischemic Symptoms  Non-invasive Testing: Intermediate Risk  If no or indeterminate stress test, FFR/iFR results in all diseased vessels: N/A  Diabetes Mellitus: No  S/P CABG: No  Antianginal therapy (number of long-acting drugs): >=2  Patient undergoing renal transplant: No  Patient undergoing percutaneous valve procedure: No  1 Vessel Disease PCI CABG  No proximal LAD involvement, No proximal left dominant LCX involvement A (8); Indication 2 M (6); Indication 2  Proximal left dominant LCX involvement A (8); Indication 5 A (8); Indication 5  Proximal LAD involvement A (8); Indication 5 A (8); Indication 5  2 Vessel Disease  No proximal LAD involvement A (8); Indication 8 A (7); Indication 8  Proximal LAD involvement A (8); Indication 11 A (8); Indication 11  3 Vessel Disease  Low disease complexity (e.g., focal stenoses, SYNTAX <=22) A (8); Indication 17 A (8); Indication 17  Intermediate or high disease complexity (e.g., SYNTAX >=23) M (6); Indication 21 A (9); Indication 21  Left Main Disease  Isolated LMCA disease: ostial or midshaft A (7); Indication 24 A (9); Indication 24  Isolated LMCA disease: bifurcation involvement M (6); Indication 25 A (9); Indication 25  LMCA ostial or midshaft,  concurrent low disease burden multivessel disease (e.g., 1-2 additional focal stenoses, SYNTAX <=22) A (7); Indication 26 A (9); Indication 26  LMCA ostial or midshaft, concurrent intermediate or high disease burden multivessel disease (e.g., 1-2 additional bifurcation stenoses, long stenoses, SYNTAX >=23) M (4); Indication 27 A (9); Indication 27  LMCA bifurcation involvement, concurrent low disease burden multivessel disease (e.g., 1-2 additional focal stenoses, SYNTAX <=22) M (6); Indication 28 A (9); Indication 28  LMCA bifurcation involvement, concurrent intermediate or high disease burden multivessel disease (e.g., 1-2 additional bifurcation stenoses, long stenoses, SYNTAX >=23) R (3); Indication 29 A (9); Indication [place]

## 2021-05-15 NOTE — H&P (Signed)
OV 04/27/2021 copied for documentation     Patient referred by Burnard Bunting, MD for chest pain  Subjective:   Cristian Cantrell, male    DOB: 09-08-1950, 70 y.o.   MRN: [id number]   Chief Complaint  Patient presents with   Hypertension   Results   Follow-up     HPI  70 y.o. Caucasian male with hypertension, hyperlipidemia, remote h/o gastric ulcer, now with exertional chest pain and dyspnea  Patient symptoms of exertional chest pain significantly improved since starting Imdur 30 mg.  However, he continues to have chest pain frequently.  His last episode was this morning, on before he had was last night during sexual intercourse.  Discussed recent test results with the patient, details below.  Surprisingly, he did not have chest pain during exercise on treadmill, but did have significant abnormalities on stress testing as detailed below.  Initial consultation HPI 03/2021: Patient lives in [place] with his wife. 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 For last few weeks, has been experiencing retrosternal chest tightness and shortness of breath with most physical activities.  Few days ago, he felt severely symptomatic and dizzy during a brisk walk, symptoms improved after rest.  In addition, he also reports a separate retrosternal burning sensation that is present even at rest.  Overall, he has had remote history of gastric ulcer in 1970s with no recent recurrence.  He denies hematochezia, melena symptoms.  Hemoglobin has been stable.  He is currently on omeprazole 20 daily.  Blood pressure is elevated today.    Current Outpatient Medications on File Prior to Visit  Medication Sig Dispense Refill   amLODipine (NORVASC) 5 MG tablet Take 2.5 mg by mouth daily.      aspirin EC 81 MG tablet Take 1 tablet (81 mg total) by mouth daily. 30 tablet 3   BYSTOLIC 10 MG tablet Take 5 mg by mouth daily.      cholecalciferol (VITAMIN D) 1000 UNITS tablet Take 1,000 Units by mouth daily.      isosorbide mononitrate (IMDUR) 30 MG 24 hr tablet Take 1 tablet (30 mg total) by mouth daily. 30 tablet 3   levothyroxine (SYNTHROID, LEVOTHROID) 50 MCG tablet Take 50 mcg by mouth daily.      losartan (COZAAR) 100 MG tablet Take 100 mg by mouth daily.     nitroGLYCERIN (NITROSTAT) 0.4 MG SL tablet Place 1 tablet (0.4 mg total) under the tongue every 5 (five) minutes as needed for chest pain. 30 tablet 3   pantoprazole (PROTONIX) 40 MG tablet Take 1 tablet (40 mg total) by mouth daily. 30 tablet 3   rosuvastatin (CRESTOR) 10 MG tablet Take 20 mg by mouth daily.     tamsulosin (FLOMAX) 0.4 MG CAPS capsule Take 0.4 mg by mouth daily.     No current facility-administered medications on file prior to visit.    Cardiovascular and other pertinent studies:  Echocardiogram 04/16/2021:  Left ventricle cavity is normal in size and wall thickness. Normal global  wall motion. Normal LV systolic function with EF 61%. Normal diastolic  filling pattern.  Left atrial cavity is mildly dilated.  Structurally normal trileaflet aortic valve. Trace aortic regurgitation.  Mild pulmonic regurgitation.  Normal right atrial pressure.   Exercise Tetrofosmin stress test 04/16/2021: Exercise nuclear stress test was performed using Bruce protocol. Patient reached 10.1 METS, and 98% of age predicted maximum heart rate. Exercise capacity was good. No chest pain reported, dyspnea reported. Heart rate and hemodynamic response  were normal. Stress EKG showed sinus tachycardia, frequent PVC's, 1.5-2 mm horizontal ST depression in leads V5, V6 persisting beyond 2 min into recovery.  SPECT images show medium sized, medium intensity, partially reversible perfusion defect in apical to basal, inferior/inferoseptal myocardium. Stress LVEF 50%. Intermediate risk study.   EKG 03/30/2021: Sinus rhythm 59 bpm First degree AV block Nonspecific T wave inversion inferior leads  Echocardiogram 2016: - Left ventricle: The cavity size  was normal. Systolic function was    normal. The estimated ejection fraction was in the range of 60%    to 65%. Wall motion was normal; there were no regional wall    motion abnormalities. Left ventricular diastolic function    parameters were normal.  - Aortic valve: Trileaflet; normal thickness leaflets. There was no    regurgitation.  - Aortic root: The aortic root was normal in size.  - Right ventricle: The cavity size was normal. Wall thickness was    normal. Systolic function was normal.  - Tricuspid valve: There was trivial regurgitation.  - Pulmonic valve: There was no regurgitation.  - Pulmonary arteries: Systolic pressure was at the upper limits of    normal. PA peak pressure: 36 mm Hg (S).  - Inferior vena cava: The vessel was normal in size.  - Pericardium, extracardiac: There was no pericardial effusion.   Stress test 2016: This is an intermediate risk study, abnormal. There is a medium defect of mild severity present in the mid anteroseptal, apical anterior and apical septal location. The defect is partially reversible suggestive of ischemia Significant ST segment depression, horizontal to upsloping seen at peak stress, 2 mm inferolateral leads, promptly resolving 2 minutes in the recovery  Cardiac MRI 2016: 1.  Normal left ventricular size and systolic function, EF 66%. 2. Normal right ventricular size and systolic function. No evidence on this study for ARVC. 3. No myocardial LGE. Therefore, no definitive evidence for prior MI, infiltrative disease, or myocarditis. From this study, there is no indication of prior MI but cannot rule out ischemia (Cardiolite reviewed, suggested MI with peri-infarct ischemia).     Recent labs: 03/27/2021: Glucose 97, BUN/Cr 16/1.4. EGFR 49. Na/K 138/4.3. Rest of the CMP normal H/H 14/42. MCV 91. Platelets 174 HbA1C N/A Chol 196, TG 137, HDL 32, LDL 137 TSH 3.1 normal    Review of Systems  Cardiovascular:  Positive for chest  pain and dyspnea on exertion. Negative for leg swelling, palpitations and syncope.  Gastrointestinal:  Positive for heartburn.        Vitals:   04/27/21 1040  BP: 131/81  Pulse: (!) 53  Resp: 16  SpO2: 97%     Body mass index is 25.16 kg/m. Filed Weights   04/27/21 1040  Weight: 185 lb 8 oz (84.1 kg)     Objective:   Physical Exam Vitals and nursing note reviewed.  Constitutional:      General: He is not in acute distress. Neck:     Vascular: No JVD.  Cardiovascular:     Rate and Rhythm: Normal rate and regular rhythm.     Heart sounds: Normal heart sounds. No murmur heard. Pulmonary:     Effort: Pulmonary effort is normal.     Breath sounds: Normal breath sounds. No wheezing or rales.  Musculoskeletal:     Right lower leg: No edema.     Left lower leg: No edema.           Assessment & Recommendations:   70 y.o. Caucasian male  with hypertension, hyperlipidemia, remote h/o gastric ulcer, now with exertional chest pain and dyspnea  Angina pectoris: Exertional chest pain and dyspnea concerning for angina. Patient has improved, but still has frequent angina symptoms in spite of optimal medical therapy.  Stress EKG at 10 METS showed sinus tachycardia, frequent PVC's, 1.5-2 mm horizontal ST depression in leads V5, V6 persisting beyond 2 min into recovery.  SPECT images show medium sized, medium intensity, partially reversible perfusion defect in apical to basal, inferior/inferoseptal myocardium. Stress LVEF 50%. Continue aspirin, statin, Imdur, Bystolic  Recommend coronary angiography and possible intervention for symptom improvement Schedule for cardiac catheterization, and possible angioplasty. We discussed regarding risks, benefits, alternatives to this including stress testing, CTA and continued medical therapy. Patient wants to proceed. Understands <1-2% risk of death, stroke, MI, urgent CABG, bleeding, infection, renal failure but not limited to these.    Will check labs. He may may need additional hydration than usual, if GFR<50.  Hypertension: Better controlled  Further recommendations after above testing  Thank you for referring the patient to [place]. Please feel free to contact with any questions.   Nigel Mormon, MD Pager: [phone] Office: [phone]

## 2021-05-16 ENCOUNTER — Encounter (HOSPITAL_COMMUNITY): Payer: Self-pay | Admitting: Cardiology

## 2021-05-16 MED FILL — Lidocaine HCl Local Preservative Free (PF) Inj 1%: INTRAMUSCULAR | Qty: 30 | Status: AC

## 2021-05-22 ENCOUNTER — Encounter: Payer: Medicare Other | Admitting: Surgery

## 2021-05-23 ENCOUNTER — Encounter: Payer: Self-pay | Admitting: Surgery

## 2021-05-23 ENCOUNTER — Other Ambulatory Visit: Payer: Self-pay | Admitting: *Deleted

## 2021-05-23 ENCOUNTER — Institutional Professional Consult (permissible substitution): Payer: Medicare Other | Admitting: Surgery

## 2021-05-23 ENCOUNTER — Other Ambulatory Visit: Payer: Self-pay

## 2021-05-23 VITALS — BP 152/76 | HR 62 | Resp 20 | Ht 72.0 in | Wt 183.0 lb

## 2021-05-23 DIAGNOSIS — I2510 Atherosclerotic heart disease of native coronary artery without angina pectoris: Secondary | ICD-10-CM

## 2021-05-23 NOTE — Progress Notes (Signed)
Cardiothoracic Surgery Consultation  PCP is Burnard Bunting, MD Referring Provider is Nigel Mormon, MD  Chief Complaint  Patient presents with   Coronary Artery Disease    Surgical consult, Cardiac Cath 05/15/21, ECHO 04/19/21    HPI:  The patient is a 70 year old gentleman with a history of hypertension, hyperlipidemia, GERD, and hypothyroidism who presents with a 1 to 48-month history of substernal chest tightness and shortness of breath with exertion.  He underwent a nuclear stress test which was an intermediate risk study showing a medium sized, medium intensity, partially reversible perfusion defect in the apical to basal inferior and inferoseptal myocardium.  Ejection fraction was 50%.  He had no chest pain during the test but did have some shortness of breath.  Electrocardiogram showed ST depression in leads V5 and V6.  A 2D echocardiogram showed normal LV systolic function with ejection fraction of 61%.  There is no significant valvular abnormality with trace aortic insufficiency.  He underwent cardiac catheterization on 05/15/2021 showing severe three-vessel coronary disease.   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   left nostril - CX3 + 5FU   Syncope    Tinnitus of left ear     Past Surgical History:  Procedure Laterality Date   LEFT HEART CATH AND CORONARY ANGIOGRAPHY N/A 05/15/2021   Procedure: LEFT HEART CATH AND CORONARY ANGIOGRAPHY;  Surgeon: Nigel Mormon, MD;  Location: [facility];  Service: Cardiovascular;  Laterality: N/A;   none     SKIN BIOPSY Left 05/03/2015   left nasal tip   SKIN BIOPSY Left 08/19/2017   left nostril    Family History  Problem Relation Age of Onset   Hypertension  Mother    Parkinson's disease Father    Heart murmur Father     Social History Social History   Tobacco Use   Smoking status: Never   Smokeless tobacco: Never  Vaping Use   Vaping Use: Never used  Substance Use Topics   Alcohol use: No    Alcohol/week: 0.0 standard drinks   Drug use: No    Current Outpatient Medications  Medication Sig Dispense Refill   amLODipine (NORVASC) 5 MG tablet Take 2.5 mg by mouth daily.      aspirin EC 81 MG tablet Take 1 tablet (81 mg total) by mouth daily. 30 tablet 3   cholecalciferol (VITAMIN D) 1000 UNITS tablet Take 1,000 Units by mouth daily.     isosorbide mononitrate (IMDUR) 30 MG 24 hr tablet Take 1 tablet (30 mg total) by mouth daily. 30 tablet 3   levothyroxine (SYNTHROID, LEVOTHROID) 50 MCG tablet Take 50 mcg by mouth daily before breakfast.     losartan (COZAAR) 100 MG tablet Take 100 mg by mouth daily.     nebivolol (BYSTOLIC) 5 MG tablet Take 5 mg by mouth daily.      nitroGLYCERIN (NITROSTAT) 0.4 MG SL tablet Place 1 tablet (0.4 mg total) under the tongue every 5 (five) minutes as needed for chest pain. 30 tablet 3   pantoprazole (PROTONIX) 40 MG tablet Take 1 tablet (40 mg total) by mouth daily. 30 tablet 3   rosuvastatin (CRESTOR) 40 MG tablet Take 1 tablet (40 mg total)  by mouth daily. 90 tablet 3   tamsulosin (FLOMAX) 0.4 MG CAPS capsule Take 0.4 mg by mouth daily.     No current facility-administered medications for this visit.    No Known Allergies  Review of Systems  Constitutional:  Positive for activity change and fatigue.  HENT: Negative.    Eyes: Negative.   Respiratory:  Positive for shortness of breath.   Cardiovascular:  Positive for chest pain. Negative for palpitations and leg swelling.  Gastrointestinal: Negative.   Endocrine: Negative.   Genitourinary:        Prostate enlargement  Musculoskeletal: Negative.   Skin: Negative.   Allergic/Immunologic: Negative.   Neurological: Negative.   Hematological:  Negative.   Psychiatric/Behavioral: Negative.     BP (!) 152/76   Pulse 62   Resp 20   Ht 6' (1.829 m)   Wt 183 lb (83 kg)   SpO2 97% Comment: RA  BMI 24.82 kg/m  Physical Exam Constitutional:      Appearance: Normal appearance. He is normal weight.  HENT:     Head: Normocephalic and atraumatic.  Eyes:     Extraocular Movements: Extraocular movements intact.     Conjunctiva/sclera: Conjunctivae normal.     Pupils: Pupils are equal, round, and reactive to light.  Cardiovascular:     Rate and Rhythm: Normal rate and regular rhythm.     Pulses: Normal pulses.     Heart sounds: Normal heart sounds. No murmur heard. Pulmonary:     Effort: Pulmonary effort is normal.     Breath sounds: Normal breath sounds.  Abdominal:     General: Abdomen is flat. Bowel sounds are normal. There is no distension.     Palpations: Abdomen is soft.     Tenderness: There is no abdominal tenderness.  Musculoskeletal:        General: No swelling. Normal range of motion.     Cervical back: Normal range of motion and neck supple.  Skin:    General: Skin is warm and dry.  Neurological:     General: No focal deficit present.     Mental Status: He is alert and oriented to person, place, and time.  Psychiatric:        Mood and Affect: Mood normal.        Behavior: Behavior normal.     Diagnostic Tests:   Echocardiogram 04/16/2021:  Left ventricle cavity is normal in size and wall thickness. Normal global  wall motion. Normal LV systolic function with EF 61%. Normal diastolic  filling pattern.  Left atrial cavity is mildly dilated.  Structurally normal trileaflet aortic valve. Trace aortic regurgitation.   Mild pulmonic regurgitation.  Normal right atrial pressure.    Physicians  Panel Physicians Referring Physician Case Authorizing Physician  Patwardhan, Reynold Bowen, MD (Primary)     Procedures  LEFT HEART CATH AND CORONARY ANGIOGRAPHY   Conclusion  LM: Normal LAD: Tortuous vessel           Prox 95% stenosis w/moderate calcification          Mid eccentric 80% stenosis w/moderate-severe calcification adjacent to medium sized diag 2, followed by a focal hazy 70% stenosis adjacent to small sized diag 3 Lcx: Prox focal 95% stenosis w/mild calcification        Distal Lcx occlusion (small vessel), left-to-left collaterals from distal LAD RCA: Prox 70%, followed by 90% stenosis adjacent to small sized RV marginal that has ostial 95% stenosis   Intermediate complexity multivessel CAD, nondiabetic, normal  LVEF Discuss CABG vs complex multivessel PCI for symptom improvement Will refer to CVTS Continue current medical management   Nigel Mormon, MD Pager: [phone] Office: [phone]   Recommendations  Antiplatelet/Anticoag Recommend Aspirin 81mg  daily for moderate CAD.   Indications  Abnormal stress test [R94.39 (ICD-10-CM)]  Angina pectoris ([place]) [I20.9 (ICD-10-CM)]   Procedural Details  Technical Details Procedures: 1. Selective left and right coronary angiography 2. Left heart catheterization 3. Conscious sedation monitoring 25 min  Indication: Angina Abnormal stress test  History: 70 y.o. Caucasian male with hypertension, hyperlipidemia, remote h/o gastric ulcer, now with exertional chest pain and dyspnea, abnormal stress test    Diagnostic Angiography: Catheter/s advances over guidewire under fluoroscopy Left coronary artery: 5 Fr JL 3.5  Right coronary artery: 5 Fr JR 4 Left heart catheterization: 5 Fr JR 4   Anticoagulation:  4000 units heparin  Total contrast used: 30 cc   Hemostasis: TR band  Total fluoro time: 1.9 min Air Kerma: 166 mGy  All wires and catheters removed out of the body at the end of the procedure Final angiogram showed no dissection/perforation  Complications: None         Estimated blood loss <50 mL.   During this procedure medications were administered to achieve and maintain moderate conscious  sedation while the patient's heart rate, blood pressure, and oxygen saturation were continuously monitored and I was present face-to-face 100% of this time.   Medications (Filter: Administrations occurring from 1422 to 1512 on 05/15/21) fentaNYL (SUBLIMAZE) injection (mcg) Total dose:  50 mcg Date/Time Rate/Dose/Volume Action   05/15/21 1435 50 mcg Given    midazolam (VERSED) injection (mg) Total dose:  1 mg Date/Time Rate/Dose/Volume Action   05/15/21 1435 1 mg Given    heparin sodium (porcine) injection (Units) Total dose:  4,000 Units Date/Time Rate/Dose/Volume Action   05/15/21 1445 4,000 Units Given    Radial Cocktail/Verapamil only (mL) Total volume:  10 mL Date/Time Rate/Dose/Volume Action   05/15/21 1443 10 mL Given    Heparin (Porcine) in NaCl 1000-0.9 UT/500ML-% SOLN (mL) Total volume:  500 mL Date/Time Rate/Dose/Volume Action   05/15/21 1500 500 mL Given    Heparin (Porcine) in NaCl 2000-0.9 UNIT/L-% SOLN (mL) Total volume:  1,000 mL Date/Time Rate/Dose/Volume Action   05/15/21 1500 1,000 mL Given    iohexol (OMNIPAQUE) 350 MG/ML injection (mL) Total volume:  30 mL Date/Time Rate/Dose/Volume Action   05/15/21 1501 30 mL Given    Sedation Time  Sedation Time Physician-1: 25 minutes 7 seconds Radiation/Fluoro  Fluoro time: 1.9 (min) DAP: 9212 (mGycm2) Cumulative Air Kerma: 295.6 (mGy) Complications  Complications documented before study signed (05/15/2021  2:13 PM)   No complications were associated with this study.  Documented by Jonne Ply, RT - 05/15/2021  3:13 PM     Coronary Findings  Diagnostic Dominance: Right Left Anterior Descending  Prox LAD lesion is 90% stenosed. The lesion is moderately calcified.  Mid LAD-2 lesion is 70% stenosed. The lesion is eccentric. The lesion is moderately calcified.    First Diagonal Branch  Vessel is small in size.    Left Circumflex  Collaterals  Dist Cx filled by collaterals from Dist LAD.     Prox Cx lesion is 95% stenosed. The lesion is mildly calcified.  Dist Cx lesion is 100% stenosed.    Right Coronary Artery  Prox RCA-1 lesion is 70% stenosed. The lesion is mildly calcified.  Prox RCA-2 lesion is 99% stenosed. The lesion is mildly calcified.  Right Ventricular Branch  RV Branch lesion is 95% stenosed.    Intervention   No interventions have been documented.   Left Heart  Left Ventricle LV end diastolic pressure is normal.   Coronary Diagrams  Diagnostic Dominance: Right Intervention  Implants     No implant documentation for this case.   Syngo Images   Show images for CARDIAC CATHETERIZATION Images on Long Term Storage   Show images for Dequavius, Kuhner to Procedure Log  Procedure Log    Hemo Data  Flowsheet Row Most Recent Value  AO Systolic Pressure 612 mmHg  AO Diastolic Pressure 67 mmHg  AO Mean 91 mmHg  LV Systolic Pressure 244 mmHg  LV Diastolic Pressure 68 mmHg  LV EDP 70 mmHg    Impression:  This 70 year old gentleman has severe three-vessel coronary disease with normal left ventricular systolic function.  I agree that coronary artery bypass graft surgery is the best treatment for symptom relief and to prevent myocardial infarction and left ventricular dysfunction. I discussed the operative procedure with the patient and family including alternatives, benefits and risks; including but not limited to bleeding, blood transfusion, infection, stroke, myocardial infarction, graft failure, heart block requiring a permanent pacemaker, organ dysfunction, and death.  Tiajuana Amass understands and agrees to proceed.    Plan:  He will be scheduled for coronary artery bypass graft surgery on Friday, 06/01/2021.  I spent 60 minutes performing this consultation and > 50% of this time was spent face to face counseling and coordinating the care of this patient's severe multi-vessel coronary artery disease.    Gaye Pollack, MD [facility] [phone]

## 2021-05-25 ENCOUNTER — Ambulatory Visit: Payer: Medicare Other | Admitting: Cardiology

## 2021-05-29 NOTE — Progress Notes (Signed)
Surgical Instructions    Your procedure is scheduled on Friday, December 9th, 2022.   Report to [facility] Main Entrance "A" at 05:30 A.M., then check in with the Admitting office.  Call this number if you have problems the morning of surgery:  [phone]   If you have any questions prior to your surgery date call [phone]: Open Monday-Friday 8am-4pm    Remember:  Do not eat or drink after midnight the night before your surgery    Take these medicines the morning of surgery with A SIP OF WATER:  amLODipine (NORVASC)   isosorbide mononitrate (IMDUR) levothyroxine (SYNTHROID, LEVOTHROID) nebivolol (BYSTOLIC)  pantoprazole (PROTONIX) rosuvastatin (CRESTOR) tamsulosin (FLOMAX)  nitroGLYCERIN (NITROSTAT) - as needed  Follow your surgeon's instructions on when to stop Aspirin.  If no instructions were given by your surgeon then you will need to call the office to get those instructions.     As of today, STOP taking any Aspirin (unless otherwise instructed by your surgeon) Aleve, Naproxen, Ibuprofen, Motrin, Advil, Goody's, BC's, all herbal medications, fish oil, and all vitamins.   After your COVID test   You are not required to quarantine however you are required to wear a well-fitting mask when you are out and around people not in your household.  If your mask becomes wet or soiled, replace with a new one.  Wash your hands often with soap and water for 20 seconds or clean your hands with an alcohol-based hand sanitizer that contains at least 60% alcohol.  Do not share personal items.  Notify your provider: if you are in close contact with someone who has COVID  or if you develop a fever of 100.4 or greater, sneezing, cough, sore throat, shortness of breath or body aches.    The day of surgery:          Do not wear jewelry  Do not wear lotions, powders, colognes, or deodorant. Men may shave face and neck. Do not bring valuables to the hospital.              [facility] is not responsible for any belongings or valuables.  Do NOT Smoke (Tobacco/Vaping)  24 hours prior to your procedure  If you use a CPAP at night, you may bring your mask for your overnight stay.   Contacts, glasses, hearing aids, dentures or partials may not be worn into surgery, please bring cases for these belongings   For patients admitted to the hospital, discharge time will be determined by your treatment team.   Patients discharged the day of surgery will not be allowed to drive home, and someone needs to stay with them for 24 hours.  NO VISITORS WILL BE ALLOWED IN PRE-OP WHERE PATIENTS ARE PREPPED FOR SURGERY.  ONLY 1 SUPPORT PERSON MAY BE PRESENT IN THE WAITING ROOM WHILE YOU ARE IN SURGERY.  IF YOU ARE TO BE ADMITTED, ONCE YOU ARE IN YOUR ROOM YOU WILL BE ALLOWED TWO (2) VISITORS. 1 (ONE) VISITOR MAY STAY OVERNIGHT BUT MUST ARRIVE TO THE ROOM BY 8pm.  Minor children may have two parents present. Special consideration for safety and communication needs will be reviewed on a case by case basis.  Special instructions:    Oral Hygiene is also important to reduce your risk of infection.  Remember - BRUSH YOUR TEETH THE MORNING OF SURGERY WITH YOUR REGULAR TOOTHPASTE   [place]- Preparing For Surgery  Before surgery, you can play an important role. Because skin is not sterile,  your skin needs to be as free of germs as possible. You can reduce the number of germs on your skin by washing with CHG (chlorahexidine gluconate) Soap before surgery.  CHG is an antiseptic cleaner which kills germs and bonds with the skin to continue killing germs even after washing.     Please do not use if you have an allergy to CHG or antibacterial soaps. If your skin becomes reddened/irritated stop using the CHG.  Do not shave (including legs and underarms) for at least 48 hours prior to first CHG shower. It is OK to shave your face.  Please follow these instructions carefully.     Shower the  NIGHT BEFORE SURGERY and the MORNING OF SURGERY with CHG Soap.   If you chose to wash your hair, wash your hair first as usual with your normal shampoo. After you shampoo, rinse your hair and body thoroughly to remove the shampoo.  Then [employer] and genitals (private parts) with your normal soap and rinse thoroughly to remove soap.  After that Use CHG Soap as you would any other liquid soap. You can apply CHG directly to the skin and wash gently with a scrungie or a clean washcloth.   Apply the CHG Soap to your body ONLY FROM THE NECK DOWN.  Do not use on open wounds or open sores. Avoid contact with your eyes, ears, mouth and genitals (private parts). Wash Face and genitals (private parts)  with your normal soap.   Wash thoroughly, paying special attention to the area where your surgery will be performed.  Thoroughly rinse your body with warm water from the neck down.  DO NOT shower/wash with your normal soap after using and rinsing off the CHG Soap.  Pat yourself dry with a CLEAN TOWEL.  Wear CLEAN PAJAMAS to bed the night before surgery  Place CLEAN SHEETS on your bed the night before your surgery  DO NOT SLEEP WITH PETS.   Day of Surgery:  Take a shower with CHG soap. Wear Clean/Comfortable clothing the morning of surgery Do not apply any deodorants/lotions.   Remember to brush your teeth WITH YOUR REGULAR TOOTHPASTE.   Please read over the following fact sheets that you were given.

## 2021-05-30 ENCOUNTER — Ambulatory Visit (HOSPITAL_COMMUNITY)
Admission: RE | Admit: 2021-05-30 | Discharge: 2021-05-30 | Disposition: A | Discharge status: Home / Self Care | Payer: Medicare Other | Source: Ambulatory Visit | Attending: Surgery | Admitting: Surgery

## 2021-05-30 ENCOUNTER — Ambulatory Visit (HOSPITAL_BASED_OUTPATIENT_CLINIC_OR_DEPARTMENT_OTHER)
Admission: RE | Admit: 2021-05-30 | Discharge: 2021-05-30 | Disposition: A | Discharge status: Home / Self Care | Payer: Medicare Other | Source: Ambulatory Visit | Attending: Surgery | Admitting: Surgery

## 2021-05-30 ENCOUNTER — Ambulatory Visit: Payer: Medicare Other | Admitting: Cardiology

## 2021-05-30 ENCOUNTER — Encounter (HOSPITAL_COMMUNITY): Payer: Self-pay

## 2021-05-30 ENCOUNTER — Encounter (HOSPITAL_COMMUNITY)
Admission: RE | Admit: 2021-05-30 | Discharge: 2021-05-30 | Disposition: A | Discharge status: Home / Self Care | Payer: Medicare Other | Source: Ambulatory Visit | Attending: Surgery | Admitting: Surgery

## 2021-05-30 ENCOUNTER — Other Ambulatory Visit: Payer: Self-pay

## 2021-05-30 VITALS — BP 144/91 | HR 52 | Temp 97.5°F | Resp 18 | Ht 72.0 in | Wt 187.1 lb

## 2021-05-30 DIAGNOSIS — Z01818 Encounter for other preprocedural examination: Secondary | ICD-10-CM | POA: Insufficient documentation

## 2021-05-30 DIAGNOSIS — I10 Essential (primary) hypertension: Secondary | ICD-10-CM | POA: Insufficient documentation

## 2021-05-30 DIAGNOSIS — Z20822 Contact with and (suspected) exposure to covid-19: Secondary | ICD-10-CM | POA: Insufficient documentation

## 2021-05-30 DIAGNOSIS — I700 Atherosclerosis of aorta: Secondary | ICD-10-CM | POA: Diagnosis not present

## 2021-05-30 DIAGNOSIS — I2510 Atherosclerotic heart disease of native coronary artery without angina pectoris: Secondary | ICD-10-CM | POA: Insufficient documentation

## 2021-05-30 HISTORY — DX: Angina pectoris, unspecified: I20.9

## 2021-05-30 HISTORY — DX: Chronic kidney disease, unspecified: N18.9

## 2021-05-30 LAB — COMPREHENSIVE METABOLIC PANEL
ALT: 23 U/L (ref 0–44)
AST: 21 U/L (ref 15–41)
Albumin: 3.8 g/dL (ref 3.5–5.0)
Alkaline Phosphatase: 67 U/L (ref 38–126)
Anion gap: 10 (ref 5–15)
BUN: 16 mg/dL (ref 8–23)
CO2: 22 mmol/L (ref 22–32)
Calcium: 9 mg/dL (ref 8.9–10.3)
Chloride: 105 mmol/L (ref 98–111)
Creatinine, Ser: 1.54 mg/dL — ABNORMAL HIGH (ref 0.61–1.24)
GFR, Estimated: 48 mL/min — ABNORMAL LOW (ref >60)
Glucose, Bld: 165 mg/dL — ABNORMAL HIGH (ref 70–99)
Potassium: 3.8 mmol/L (ref 3.5–5.1)
Sodium: 137 mmol/L (ref 135–145)
Total Bilirubin: 0.7 mg/dL (ref 0.3–1.2)
Total Protein: 6.6 g/dL (ref 6.5–8.1)

## 2021-05-30 LAB — URINALYSIS, ROUTINE W REFLEX MICROSCOPIC
Bilirubin Urine: NEGATIVE
Glucose, UA: NEGATIVE mg/dL
Hgb urine dipstick: NEGATIVE
Ketones, ur: NEGATIVE mg/dL
Leukocytes,Ua: NEGATIVE
Nitrite: NEGATIVE
Protein, ur: NEGATIVE mg/dL
Specific Gravity, Urine: 1.01 (ref 1.005–1.030)
pH: 6 (ref 5.0–8.0)

## 2021-05-30 LAB — BLOOD GAS, ARTERIAL
Acid-base deficit: 0.7 mmol/L (ref 0.0–2.0)
Bicarbonate: 23.6 mmol/L (ref 20.0–28.0)
FIO2: 21
O2 Saturation: 97 %
Patient temperature: 37
pCO2 arterial: 40.3 mmHg (ref 32.0–48.0)
pH, Arterial: 7.385 (ref 7.350–7.450)
pO2, Arterial: 89.2 mmHg (ref 83.0–108.0)

## 2021-05-30 LAB — CBC
HCT: 44.1 % (ref 39.0–52.0)
Hemoglobin: 14.6 g/dL (ref 13.0–17.0)
MCH: 30.8 pg (ref 26.0–34.0)
MCHC: 33.1 g/dL (ref 30.0–36.0)
MCV: 93 fL (ref 80.0–100.0)
Platelets: 164 10*3/uL (ref 150–400)
RBC: 4.74 MIL/uL (ref 4.22–5.81)
RDW: 12.6 % (ref 11.5–15.5)
WBC: 6 10*3/uL (ref 4.0–10.5)
nRBC: 0 % (ref 0.0–0.2)

## 2021-05-30 LAB — TYPE AND SCREEN
ABO/RH(D): O POS
Antibody Screen: NEGATIVE

## 2021-05-30 LAB — SURGICAL PCR SCREEN
MRSA, PCR: NEGATIVE
Staphylococcus aureus: NEGATIVE

## 2021-05-30 LAB — PROTIME-INR
INR: 1.2 (ref 0.8–1.2)
Prothrombin Time: 15.3 seconds — ABNORMAL HIGH (ref 11.4–15.2)

## 2021-05-30 LAB — APTT: aPTT: 39 seconds — ABNORMAL HIGH (ref 24–36)

## 2021-05-30 LAB — HEMOGLOBIN A1C
Hgb A1c MFr Bld: 5.6 % (ref 4.8–5.6)
Mean Plasma Glucose: 114.02 mg/dL

## 2021-05-30 NOTE — Progress Notes (Signed)
Abnormal lab in PAT: PT 15.3. Dr. Cyndia Bent office was notified Thurmond Butts, [place], [place]).

## 2021-05-30 NOTE — Progress Notes (Signed)
Pre-CABG testing has been completed. Preliminary results can be found in CV Proc through chart review.   05/30/21 3:58 PM Cristian Cantrell RVT

## 2021-05-30 NOTE — Progress Notes (Signed)
PCP - Burnard Bunting, MD Cardiologist - Vernell Leep, MD  PPM/ICD - denies Device Orders - n/a Rep Notified - n/a  Chest x-ray - 05/30/2021 EKG - 05/30/2021 Stress Test - 04/16/2021 - CE ECHO - 04/16/2021 - CE Cardiac Cath - 05/15/2021  Sleep Study - denies CPAP - n/a  Fasting Blood Sugar - n/a  Blood Thinner Instructions: n/a  Aspirin Instructions: Aspirin - patient will hold Aspirin the day of surgery.   Patient was instructed: As of today, STOP taking any Aspirin (unless otherwise instructed by your surgeon) Aleve, Naproxen, Ibuprofen, Motrin, Advil, Goody's, BC's, all herbal medications, fish oil, and all vitamins.  ERAS Protcol - n/a  COVID TEST- done in PAT on 05/30/2021   Anesthesia review: yes - cardiac history  Patient denies shortness of breath, fever, cough and chest pain at PAT appointment   All instructions explained to the patient, with a verbal understanding of the material. Patient agrees to go over the instructions while at home for a better understanding. Patient also instructed to self quarantine after being tested for COVID-19. The opportunity to ask questions was provided.

## 2021-05-31 LAB — SARS CORONAVIRUS 2 (TAT 6-24 HRS): SARS Coronavirus 2: NEGATIVE

## 2021-05-31 MED ORDER — DEXMEDETOMIDINE HCL IN NACL 400 MCG/100ML IV SOLN
0.1000 ug/kg/h | INTRAVENOUS | Status: AC
  Administered 2021-06-01: .3 ug/kg/h via INTRAVENOUS
  Filled 2021-05-31: qty 100

## 2021-05-31 MED ORDER — TRANEXAMIC ACID 1000 MG/10ML IV SOLN
1.5000 mg/kg/h | INTRAVENOUS | Status: AC
  Administered 2021-06-01: 1.5 mg/kg/h via INTRAVENOUS
  Filled 2021-05-31: qty 25

## 2021-05-31 MED ORDER — TRANEXAMIC ACID (OHS) PUMP PRIME SOLUTION
2.0000 mg/kg | INTRAVENOUS | Status: DC
  Filled 2021-05-31: qty 1.7

## 2021-05-31 MED ORDER — MILRINONE LACTATE IN DEXTROSE 20-5 MG/100ML-% IV SOLN
0.3000 ug/kg/min | INTRAVENOUS | Status: DC
  Filled 2021-05-31: qty 100

## 2021-05-31 MED ORDER — NITROGLYCERIN IN D5W 200-5 MCG/ML-% IV SOLN
2.0000 ug/min | INTRAVENOUS | Status: AC
  Administered 2021-06-01: 5 ug/min via INTRAVENOUS
  Filled 2021-05-31: qty 250

## 2021-05-31 MED ORDER — POTASSIUM CHLORIDE 2 MEQ/ML IV SOLN
80.0000 meq | INTRAVENOUS | Status: DC
  Filled 2021-05-31: qty 40

## 2021-05-31 MED ORDER — PLASMA-LYTE A IV SOLN
INTRAVENOUS | Status: DC
  Filled 2021-05-31: qty 5

## 2021-05-31 MED ORDER — INSULIN REGULAR(HUMAN) IN NACL 100-0.9 UT/100ML-% IV SOLN
INTRAVENOUS | Status: AC
  Administered 2021-06-01: 1 [IU]/h via INTRAVENOUS
  Filled 2021-05-31: qty 100

## 2021-05-31 MED ORDER — PHENYLEPHRINE HCL-NACL 20-0.9 MG/250ML-% IV SOLN
30.0000 ug/min | INTRAVENOUS | Status: AC
Start: 2021-06-01 — End: 2021-06-01
  Administered 2021-06-01: 25 ug/min via INTRAVENOUS
  Filled 2021-05-31: qty 250

## 2021-05-31 MED ORDER — CEFAZOLIN SODIUM-DEXTROSE 2-4 GM/100ML-% IV SOLN
2.0000 g | INTRAVENOUS | Status: DC
  Filled 2021-05-31: qty 100

## 2021-05-31 MED ORDER — HEPARIN 30,000 UNITS/1000 ML (OHS) CELLSAVER SOLUTION
Status: DC
  Filled 2021-05-31: qty 1000

## 2021-05-31 MED ORDER — NOREPINEPHRINE 4 MG/250ML-% IV SOLN
0.0000 ug/min | INTRAVENOUS | Status: DC
  Filled 2021-05-31: qty 250

## 2021-05-31 MED ORDER — EPINEPHRINE HCL 5 MG/250ML IV SOLN IN NS
0.0000 ug/min | INTRAVENOUS | Status: DC
  Filled 2021-05-31: qty 250

## 2021-05-31 MED ORDER — MANNITOL 20 % IV SOLN
INTRAVENOUS | Status: DC
  Filled 2021-05-31: qty 13

## 2021-05-31 MED ORDER — TRANEXAMIC ACID (OHS) BOLUS VIA INFUSION
15.0000 mg/kg | INTRAVENOUS | Status: AC
  Administered 2021-06-01: 1273.5 mg via INTRAVENOUS
  Filled 2021-05-31: qty 1274

## 2021-05-31 MED ORDER — VANCOMYCIN HCL 1500 MG/300ML IV SOLN
1500.0000 mg | INTRAVENOUS | Status: AC
  Administered 2021-06-01: 1500 mg via INTRAVENOUS
  Filled 2021-05-31: qty 300

## 2021-05-31 MED ORDER — CEFAZOLIN SODIUM-DEXTROSE 2-4 GM/100ML-% IV SOLN
2.0000 g | INTRAVENOUS | Status: AC
  Administered 2021-06-01 (×2): 2 g via INTRAVENOUS
  Filled 2021-05-31: qty 100

## 2021-05-31 NOTE — Hospital Course (Addendum)
HPI: The patient is a 70 year old gentleman with a history of hypertension, hyperlipidemia, GERD, and hypothyroidism who presents with a 1 to 65-month history of substernal chest tightness and shortness of breath with exertion.  He underwent a nuclear stress test which was an intermediate risk study showing a medium sized, medium intensity, partially reversible perfusion defect in the apical to basal inferior and inferoseptal myocardium.  Ejection fraction was 50%.  He had no chest pain during the test but did have some shortness of breath.  Electrocardiogram showed ST depression in leads V5 and V6.  A 2D echocardiogram showed normal LV systolic function with ejection fraction of 61%.  There is no significant valvular abnormality with trace aortic insufficiency.  He underwent cardiac catheterization on 05/15/2021 showing severe three-vessel coronary disease. Dr. Cyndia Bent discussed the need for coronary artery bypass grafting surgery. Potential risks, benefits, and complications of the surgery were discussed with the patient and he agreed to proceed. Pre operative carotid duplex US showed no significant internal carotid artery stenosis bilaterally.  Hospital Course: Patient underwent a CABG x 4. He was transferred from the OR to [facility] ICU in stable condition.  Vital signs, cardiac rhythm, and hemodynamics remained stable.  He was weaned from mechanical ventilation and extubated by 6 PM on the day of surgery.  The chest tubes and monitoring lines were removed routinely on the first postoperative day.  He was started on aspirin, beta-blocker, and statin.  Glucose was monitored and sliding scale insulin provided as required.  He was noted to have some gastric distention on the first postoperative day and was treated with IV Reglan.  He was mobilized and progressed well with ambulation.  On the second postoperative day, he was noted to have thrombocytopenia with a platelet count of 90,000.  The enoxaparin was discontinued  at that point.  He was otherwise stable and felt ready for transfer to 4E progressive care  He will be started on Plavix and ec asa will be decreased to 81 mg after pacer wire removal

## 2021-05-31 NOTE — H&P (Signed)
[address]       [place] [address]             [phone]      Cardiothoracic Surgery Admission History and Physical   PCP is Burnard Bunting, MD Referring Provider is Nigel Mormon, MD       Chief Complaint  Patient presents with   Coronary Artery Disease            HPI:   The patient is a 70 year old gentleman with a history of hypertension, hyperlipidemia, GERD, and hypothyroidism who presents with a 1 to 33-month history of substernal chest tightness and shortness of breath with exertion.  He underwent a nuclear stress test which was an intermediate risk study showing a medium sized, medium intensity, partially reversible perfusion defect in the apical to basal inferior and inferoseptal myocardium.  Ejection fraction was 50%.  He had no chest pain during the test but did have some shortness of breath.  Electrocardiogram showed ST depression in leads V5 and V6.  A 2D echocardiogram showed normal LV systolic function with ejection fraction of 61%.  There is no significant valvular abnormality with trace aortic insufficiency.  He underwent cardiac catheterization on 05/15/2021 showing severe three-vessel coronary disease.         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/19/2017    left nostril - CX3 + 5FU   Syncope     Tinnitus of left ear             Past Surgical History:  Procedure Laterality Date   LEFT HEART CATH AND CORONARY ANGIOGRAPHY N/A 05/15/2021    Procedure: LEFT HEART CATH AND CORONARY ANGIOGRAPHY;  Surgeon: Nigel Mormon, MD;  Location: [facility];  Service: Cardiovascular;  Laterality: N/A;   none       SKIN BIOPSY Left 05/03/2015    left nasal tip   SKIN BIOPSY Left  08/19/2017    left nostril           Family History  Problem Relation Age of Onset   Hypertension Mother     Parkinson's disease Father     Heart murmur Father        Social History Social History         Tobacco Use   Smoking status: Never   Smokeless tobacco: Never  Vaping Use   Vaping Use: Never used  Substance Use Topics   Alcohol use: No      Alcohol/week: 0.0 standard drinks   Drug use: No            Current Outpatient Medications  Medication Sig Dispense Refill   amLODipine (NORVASC) 5 MG tablet Take 2.5 mg by mouth daily.        aspirin EC 81 MG tablet Take 1 tablet (81 mg total) by mouth daily. 30 tablet 3   cholecalciferol (VITAMIN D) 1000 UNITS tablet Take 1,000 Units by mouth daily.       isosorbide mononitrate (IMDUR) 30 MG 24 hr tablet Take 1 tablet (30 mg total) by mouth daily. 30 tablet 3   levothyroxine (SYNTHROID, LEVOTHROID) 50 MCG tablet Take  50 mcg by mouth daily before breakfast.       losartan (COZAAR) 100 MG tablet Take 100 mg by mouth daily.       nebivolol (BYSTOLIC) 5 MG tablet Take 5 mg by mouth daily.        nitroGLYCERIN (NITROSTAT) 0.4 MG SL tablet Place 1 tablet (0.4 mg total) under the tongue every 5 (five) minutes as needed for chest pain. 30 tablet 3   pantoprazole (PROTONIX) 40 MG tablet Take 1 tablet (40 mg total) by mouth daily. 30 tablet 3   rosuvastatin (CRESTOR) 40 MG tablet Take 1 tablet (40 mg total) by mouth daily. 90 tablet 3   tamsulosin (FLOMAX) 0.4 MG CAPS capsule Take 0.4 mg by mouth daily.        No current facility-administered medications for this visit.      No Known Allergies   Review of Systems  Constitutional:  Positive for activity change and fatigue.  HENT: Negative.    Eyes: Negative.   Respiratory:  Positive for shortness of breath.   Cardiovascular:  Positive for chest pain. Negative for palpitations and leg swelling.  Gastrointestinal: Negative.   Endocrine: Negative.   Genitourinary:        Prostate  enlargement  Musculoskeletal: Negative.   Skin: Negative.   Allergic/Immunologic: Negative.   Neurological: Negative.   Hematological: Negative.   Psychiatric/Behavioral: Negative.      BP (!) 152/76   Pulse 62   Resp 20   Ht 6' (1.829 m)   Wt 183 lb (83 kg)   SpO2 97% Comment: RA  BMI 24.82 kg/m  Physical Exam Constitutional:      Appearance: Normal appearance. He is normal weight.  HENT:     Head: Normocephalic and atraumatic.  Eyes:     Extraocular Movements: Extraocular movements intact.     Conjunctiva/sclera: Conjunctivae normal.     Pupils: Pupils are equal, round, and reactive to light.  Cardiovascular:     Rate and Rhythm: Normal rate and regular rhythm.     Pulses: Normal pulses.     Heart sounds: Normal heart sounds. No murmur heard. Pulmonary:     Effort: Pulmonary effort is normal.     Breath sounds: Normal breath sounds.  Abdominal:     General: Abdomen is flat. Bowel sounds are normal. There is no distension.     Palpations: Abdomen is soft.     Tenderness: There is no abdominal tenderness.  Musculoskeletal:        General: No swelling. Normal range of motion.     Cervical back: Normal range of motion and neck supple.  Skin:    General: Skin is warm and dry.  Neurological:     General: No focal deficit present.     Mental Status: He is alert and oriented to person, place, and time.  Psychiatric:        Mood and Affect: Mood normal.        Behavior: Behavior normal.        Diagnostic Tests:     Echocardiogram 04/16/2021:  Left ventricle cavity is normal in size and wall thickness. Normal global  wall motion. Normal LV systolic function with EF 61%. Normal diastolic  filling pattern.  Left atrial cavity is mildly dilated.  Structurally normal trileaflet aortic valve. Trace aortic regurgitation.   Mild pulmonic regurgitation.  Normal right atrial pressure.      Physicians   Panel Physicians Referring Physician Case Authorizing Physician   Vernell Leep  J, MD (Primary)        Procedures   LEFT HEART CATH AND CORONARY ANGIOGRAPHY    Conclusion   LM: Normal LAD: Tortuous vessel          Prox 95% stenosis w/moderate calcification          Mid eccentric 80% stenosis w/moderate-severe calcification adjacent to medium sized diag 2, followed by a focal hazy 70% stenosis adjacent to small sized diag 3 Lcx: Prox focal 95% stenosis w/mild calcification        Distal Lcx occlusion (small vessel), left-to-left collaterals from distal LAD RCA: Prox 70%, followed by 90% stenosis adjacent to small sized RV marginal that has ostial 95% stenosis   Intermediate complexity multivessel CAD, nondiabetic, normal LVEF Discuss CABG vs complex multivessel PCI for symptom improvement Will refer to CVTS Continue current medical management   Nigel Mormon, MD Pager: [phone] Office: [phone]   Recommendations   Antiplatelet/Anticoag Recommend Aspirin 81mg  daily for moderate CAD.    Indications   Abnormal stress test [R94.39 (ICD-10-CM)]  Angina pectoris ([place]) [I20.9 (ICD-10-CM)]    Procedural Details   Technical Details Procedures: 1. Selective left and right coronary angiography 2. Left heart catheterization 3. Conscious sedation monitoring 25 min  Indication: Angina Abnormal stress test  History: 70 y.o. Caucasian male with hypertension, hyperlipidemia, remote h/o gastric ulcer, now with exertional chest pain and dyspnea, abnormal stress test    Diagnostic Angiography: Catheter/s advances over guidewire under fluoroscopy Left coronary artery: 5 Fr JL 3.5  Right coronary artery: 5 Fr JR 4 Left heart catheterization: 5 Fr JR 4   Anticoagulation:  4000 units heparin  Total contrast used: 30 cc   Hemostasis: TR band  Total fluoro time: 1.9 min Air Kerma: 166 mGy  All wires and catheters removed out of the body at the end of the procedure Final angiogram showed no  dissection/perforation  Complications: None         Estimated blood loss <50 mL.   During this procedure medications were administered to achieve and maintain moderate conscious sedation while the patient's heart rate, blood pressure, and oxygen saturation were continuously monitored and I was present face-to-face 100% of this time.    Medications (Filter: Administrations occurring from 1422 to 1512 on 05/15/21) fentaNYL (SUBLIMAZE) injection (mcg) Total dose:  50 mcg Date/Time Rate/Dose/Volume Action    05/15/21 1435 50 mcg Given      midazolam (VERSED) injection (mg) Total dose:  1 mg Date/Time Rate/Dose/Volume Action    05/15/21 1435 1 mg Given      heparin sodium (porcine) injection (Units) Total dose:  4,000 Units Date/Time Rate/Dose/Volume Action    05/15/21 1445 4,000 Units Given      Radial Cocktail/Verapamil only (mL) Total volume:  10 mL Date/Time Rate/Dose/Volume Action    05/15/21 1443 10 mL Given      Heparin (Porcine) in NaCl 1000-0.9 UT/500ML-% SOLN (mL) Total volume:  500 mL Date/Time Rate/Dose/Volume Action    05/15/21 1500 500 mL Given      Heparin (Porcine) in NaCl 2000-0.9 UNIT/L-% SOLN (mL) Total volume:  1,000 mL Date/Time Rate/Dose/Volume Action    05/15/21 1500 1,000 mL Given      iohexol (OMNIPAQUE) 350 MG/ML injection (mL) Total volume:  30 mL Date/Time Rate/Dose/Volume Action    05/15/21 1501 30 mL Given      Sedation Time   Sedation Time Physician-1: 25 minutes 7 seconds Radiation/Fluoro   Fluoro time: 1.9 (min) DAP: 9212 (mGycm2) Cumulative Air  Kerma: 503.8 (mGy) Complications      Complications documented before study signed (05/15/2021  8:82 PM)     No complications were associated with this study.  Documented by Jonne Ply, RT - 05/15/2021  3:13 PM      Coronary Findings   Diagnostic Dominance: Right Left Anterior Descending  Prox LAD lesion is 90% stenosed. The lesion is moderately calcified.  Mid LAD-2  lesion is 70% stenosed. The lesion is eccentric. The lesion is moderately calcified.    First Diagonal Branch  Vessel is small in size.    Left Circumflex  Collaterals  Dist Cx filled by collaterals from Dist LAD.     Prox Cx lesion is 95% stenosed. The lesion is mildly calcified.  Dist Cx lesion is 100% stenosed.    Right Coronary Artery  Prox RCA-1 lesion is 70% stenosed. The lesion is mildly calcified.  Prox RCA-2 lesion is 99% stenosed. The lesion is mildly calcified.    Right Ventricular Branch  RV Branch lesion is 95% stenosed.    Intervention    No interventions have been documented.    Left Heart   Left Ventricle LV end diastolic pressure is normal.    Coronary Diagrams   Diagnostic Dominance: Right Intervention   Implants      No implant documentation for this case.    Syngo Images    Show images for CARDIAC CATHETERIZATION Images on Long Term Storage    Show images for Bannon, [place] to Procedure Log   Procedure Log    Hemo Data   Flowsheet Row Most Recent Value  AO Systolic Pressure 800 mmHg  AO Diastolic Pressure 67 mmHg  AO Mean 91 mmHg  LV Systolic Pressure 349 mmHg  LV Diastolic Pressure 68 mmHg  LV EDP 70 mmHg      Impression:   This 70 year old gentleman has severe three-vessel coronary disease with normal left ventricular systolic function.  I agree that coronary artery bypass graft surgery is the best treatment for symptom relief and to prevent myocardial infarction and left ventricular dysfunction. I discussed the operative procedure with the patient and family including alternatives, benefits and risks; including but not limited to bleeding, blood transfusion, infection, stroke, myocardial infarction, graft failure, heart block requiring a permanent pacemaker, organ dysfunction, and death.  Tiajuana Amass understands and agrees to proceed.     Plan:   Coronary artery bypass graft surgery.      Gaye Pollack, MD [facility] [phone]

## 2021-06-01 ENCOUNTER — Inpatient Hospital Stay (HOSPITAL_COMMUNITY)
Admission: RE | Disposition: A | Discharge status: Home / Self Care | Payer: Self-pay | Source: Home / Self Care | Attending: Surgery

## 2021-06-01 ENCOUNTER — Inpatient Hospital Stay (HOSPITAL_COMMUNITY)
Admission: RE | Admit: 2021-06-01 | Discharge: 2021-06-06 | DRG: 236 | Disposition: A | Discharge status: Home / Self Care | Payer: Medicare Other | Attending: Surgery | Admitting: Surgery

## 2021-06-01 ENCOUNTER — Inpatient Hospital Stay (HOSPITAL_COMMUNITY): Payer: Medicare Other

## 2021-06-01 ENCOUNTER — Encounter (HOSPITAL_COMMUNITY): Payer: Self-pay | Admitting: Surgery

## 2021-06-01 ENCOUNTER — Other Ambulatory Visit: Payer: Self-pay

## 2021-06-01 ENCOUNTER — Inpatient Hospital Stay (HOSPITAL_COMMUNITY): Payer: Medicare Other | Admitting: Anesthesiology

## 2021-06-01 DIAGNOSIS — E8770 Fluid overload, unspecified: Secondary | ICD-10-CM | POA: Diagnosis present

## 2021-06-01 DIAGNOSIS — I493 Ventricular premature depolarization: Secondary | ICD-10-CM | POA: Diagnosis not present

## 2021-06-01 DIAGNOSIS — Z8249 Family history of ischemic heart disease and other diseases of the circulatory system: Secondary | ICD-10-CM | POA: Diagnosis not present

## 2021-06-01 DIAGNOSIS — Z79890 Hormone replacement therapy (postmenopausal): Secondary | ICD-10-CM | POA: Diagnosis not present

## 2021-06-01 DIAGNOSIS — I1310 Hypertensive heart and chronic kidney disease without heart failure, with stage 1 through stage 4 chronic kidney disease, or unspecified chronic kidney disease: Secondary | ICD-10-CM | POA: Diagnosis present

## 2021-06-01 DIAGNOSIS — E039 Hypothyroidism, unspecified: Secondary | ICD-10-CM | POA: Diagnosis present

## 2021-06-01 DIAGNOSIS — Z951 Presence of aortocoronary bypass graft: Secondary | ICD-10-CM | POA: Diagnosis not present

## 2021-06-01 DIAGNOSIS — Z20822 Contact with and (suspected) exposure to covid-19: Secondary | ICD-10-CM | POA: Diagnosis present

## 2021-06-01 DIAGNOSIS — Z7982 Long term (current) use of aspirin: Secondary | ICD-10-CM

## 2021-06-01 DIAGNOSIS — I2510 Atherosclerotic heart disease of native coronary artery without angina pectoris: Principal | ICD-10-CM | POA: Diagnosis present

## 2021-06-01 DIAGNOSIS — Z4682 Encounter for fitting and adjustment of non-vascular catheter: Secondary | ICD-10-CM | POA: Diagnosis not present

## 2021-06-01 DIAGNOSIS — D696 Thrombocytopenia, unspecified: Secondary | ICD-10-CM | POA: Diagnosis present

## 2021-06-01 DIAGNOSIS — Z820 Family history of epilepsy and other diseases of the nervous system: Secondary | ICD-10-CM | POA: Diagnosis not present

## 2021-06-01 DIAGNOSIS — J939 Pneumothorax, unspecified: Secondary | ICD-10-CM

## 2021-06-01 DIAGNOSIS — I517 Cardiomegaly: Secondary | ICD-10-CM | POA: Diagnosis not present

## 2021-06-01 DIAGNOSIS — J90 Pleural effusion, not elsewhere classified: Secondary | ICD-10-CM | POA: Diagnosis not present

## 2021-06-01 DIAGNOSIS — N1831 Chronic kidney disease, stage 3a: Secondary | ICD-10-CM | POA: Diagnosis present

## 2021-06-01 DIAGNOSIS — Z79899 Other long term (current) drug therapy: Secondary | ICD-10-CM | POA: Diagnosis not present

## 2021-06-01 DIAGNOSIS — E785 Hyperlipidemia, unspecified: Secondary | ICD-10-CM | POA: Diagnosis present

## 2021-06-01 DIAGNOSIS — K219 Gastro-esophageal reflux disease without esophagitis: Secondary | ICD-10-CM | POA: Diagnosis present

## 2021-06-01 DIAGNOSIS — J9811 Atelectasis: Secondary | ICD-10-CM | POA: Diagnosis not present

## 2021-06-01 HISTORY — PX: ENDOVEIN HARVEST OF GREATER SAPHENOUS VEIN: SHX5059

## 2021-06-01 HISTORY — PX: TEE WITHOUT CARDIOVERSION: SHX5443

## 2021-06-01 HISTORY — PX: CORONARY ARTERY BYPASS GRAFT: SHX141

## 2021-06-01 LAB — POCT I-STAT EG7
Acid-Base Excess: 1 mmol/L (ref 0.0–2.0)
Bicarbonate: 25.2 mmol/L (ref 20.0–28.0)
Calcium, Ion: 1.07 mmol/L — ABNORMAL LOW (ref 1.15–1.40)
HCT: 28 % — ABNORMAL LOW (ref 39.0–52.0)
Hemoglobin: 9.5 g/dL — ABNORMAL LOW (ref 13.0–17.0)
O2 Saturation: 92 %
Potassium: 4.7 mmol/L (ref 3.5–5.1)
Sodium: 137 mmol/L (ref 135–145)
TCO2: 26 mmol/L (ref 22–32)
pCO2, Ven: 37.4 mmHg — ABNORMAL LOW (ref 44.0–60.0)
pH, Ven: 7.437 — ABNORMAL HIGH (ref 7.250–7.430)
pO2, Ven: 61 mmHg — ABNORMAL HIGH (ref 32.0–45.0)

## 2021-06-01 LAB — POCT I-STAT 7, (LYTES, BLD GAS, ICA,H+H)
Acid-Base Excess: 1 mmol/L (ref 0.0–2.0)
Acid-Base Excess: 3 mmol/L — ABNORMAL HIGH (ref 0.0–2.0)
Acid-Base Excess: 1 mmol/L (ref 0.0–2.0)
Acid-Base Excess: 2 mmol/L (ref 0.0–2.0)
Acid-Base Excess: 1 mmol/L (ref 0.0–2.0)
Acid-Base Excess: 2 mmol/L (ref 0.0–2.0)
Acid-base deficit: 1 mmol/L (ref 0.0–2.0)
Acid-base deficit: 4 mmol/L — ABNORMAL HIGH (ref 0.0–2.0)
Acid-base deficit: 5 mmol/L — ABNORMAL HIGH (ref 0.0–2.0)
Bicarbonate: 27.9 mmol/L (ref 20.0–28.0)
Bicarbonate: 27.3 mmol/L (ref 20.0–28.0)
Bicarbonate: 25.2 mmol/L (ref 20.0–28.0)
Bicarbonate: 25.4 mmol/L (ref 20.0–28.0)
Bicarbonate: 24.6 mmol/L (ref 20.0–28.0)
Bicarbonate: 26.6 mmol/L (ref 20.0–28.0)
Bicarbonate: 22.8 mmol/L (ref 20.0–28.0)
Bicarbonate: 22.5 mmol/L (ref 20.0–28.0)
Bicarbonate: 20.1 mmol/L (ref 20.0–28.0)
Calcium, Ion: 1.27 mmol/L (ref 1.15–1.40)
Calcium, Ion: 1.03 mmol/L — ABNORMAL LOW (ref 1.15–1.40)
Calcium, Ion: 1 mmol/L — ABNORMAL LOW (ref 1.15–1.40)
Calcium, Ion: 1.05 mmol/L — ABNORMAL LOW (ref 1.15–1.40)
Calcium, Ion: 1.05 mmol/L — ABNORMAL LOW (ref 1.15–1.40)
Calcium, Ion: 1.08 mmol/L — ABNORMAL LOW (ref 1.15–1.40)
Calcium, Ion: 1.08 mmol/L — ABNORMAL LOW (ref 1.15–1.40)
Calcium, Ion: 1.15 mmol/L (ref 1.15–1.40)
Calcium, Ion: 1.12 mmol/L — ABNORMAL LOW (ref 1.15–1.40)
HCT: 39 % (ref 39.0–52.0)
HCT: 27 % — ABNORMAL LOW (ref 39.0–52.0)
HCT: 26 % — ABNORMAL LOW (ref 39.0–52.0)
HCT: 27 % — ABNORMAL LOW (ref 39.0–52.0)
HCT: 26 % — ABNORMAL LOW (ref 39.0–52.0)
HCT: 27 % — ABNORMAL LOW (ref 39.0–52.0)
HCT: 29 % — ABNORMAL LOW (ref 39.0–52.0)
HCT: 35 % — ABNORMAL LOW (ref 39.0–52.0)
HCT: 32 % — ABNORMAL LOW (ref 39.0–52.0)
Hemoglobin: 13.3 g/dL (ref 13.0–17.0)
Hemoglobin: 9.2 g/dL — ABNORMAL LOW (ref 13.0–17.0)
Hemoglobin: 8.8 g/dL — ABNORMAL LOW (ref 13.0–17.0)
Hemoglobin: 9.2 g/dL — ABNORMAL LOW (ref 13.0–17.0)
Hemoglobin: 8.8 g/dL — ABNORMAL LOW (ref 13.0–17.0)
Hemoglobin: 9.2 g/dL — ABNORMAL LOW (ref 13.0–17.0)
Hemoglobin: 9.9 g/dL — ABNORMAL LOW (ref 13.0–17.0)
Hemoglobin: 11.9 g/dL — ABNORMAL LOW (ref 13.0–17.0)
Hemoglobin: 10.9 g/dL — ABNORMAL LOW (ref 13.0–17.0)
O2 Saturation: 100 %
O2 Saturation: 100 %
O2 Saturation: 100 %
O2 Saturation: 100 %
O2 Saturation: 100 %
O2 Saturation: 100 %
O2 Saturation: 99 %
O2 Saturation: 98 %
O2 Saturation: 96 %
Patient temperature: 36
Patient temperature: 36.9
Patient temperature: 37.5
Potassium: 3.8 mmol/L (ref 3.5–5.1)
Potassium: 4.8 mmol/L (ref 3.5–5.1)
Potassium: 4.8 mmol/L (ref 3.5–5.1)
Potassium: 4.9 mmol/L (ref 3.5–5.1)
Potassium: 4.1 mmol/L (ref 3.5–5.1)
Potassium: 4.5 mmol/L (ref 3.5–5.1)
Potassium: 3.4 mmol/L — ABNORMAL LOW (ref 3.5–5.1)
Potassium: 4.6 mmol/L (ref 3.5–5.1)
Potassium: 4.8 mmol/L (ref 3.5–5.1)
Sodium: 143 mmol/L (ref 135–145)
Sodium: 137 mmol/L (ref 135–145)
Sodium: 138 mmol/L (ref 135–145)
Sodium: 138 mmol/L (ref 135–145)
Sodium: 139 mmol/L (ref 135–145)
Sodium: 140 mmol/L (ref 135–145)
Sodium: 141 mmol/L (ref 135–145)
Sodium: 139 mmol/L (ref 135–145)
Sodium: 139 mmol/L (ref 135–145)
TCO2: 29 mmol/L (ref 22–32)
TCO2: 28 mmol/L (ref 22–32)
TCO2: 26 mmol/L (ref 22–32)
TCO2: 26 mmol/L (ref 22–32)
TCO2: 26 mmol/L (ref 22–32)
TCO2: 28 mmol/L (ref 22–32)
TCO2: 24 mmol/L (ref 22–32)
TCO2: 24 mmol/L (ref 22–32)
TCO2: 21 mmol/L — ABNORMAL LOW (ref 22–32)
pCO2 arterial: 53 mmHg — ABNORMAL HIGH (ref 32.0–48.0)
pCO2 arterial: 37.5 mmHg (ref 32.0–48.0)
pCO2 arterial: 33.7 mmHg (ref 32.0–48.0)
pCO2 arterial: 35.6 mmHg (ref 32.0–48.0)
pCO2 arterial: 35.8 mmHg (ref 32.0–48.0)
pCO2 arterial: 41.6 mmHg (ref 32.0–48.0)
pCO2 arterial: 33 mmHg (ref 32.0–48.0)
pCO2 arterial: 45.1 mmHg (ref 32.0–48.0)
pCO2 arterial: 36.8 mmHg (ref 32.0–48.0)
pH, Arterial: 7.33 — ABNORMAL LOW (ref 7.350–7.450)
pH, Arterial: 7.471 — ABNORMAL HIGH (ref 7.350–7.450)
pH, Arterial: 7.457 — ABNORMAL HIGH (ref 7.350–7.450)
pH, Arterial: 7.485 — ABNORMAL HIGH (ref 7.350–7.450)
pH, Arterial: 7.413 (ref 7.350–7.450)
pH, Arterial: 7.445 (ref 7.350–7.450)
pH, Arterial: 7.444 (ref 7.350–7.450)
pH, Arterial: 7.305 — ABNORMAL LOW (ref 7.350–7.450)
pH, Arterial: 7.348 — ABNORMAL LOW (ref 7.350–7.450)
pO2, Arterial: 386 mmHg — ABNORMAL HIGH (ref 83.0–108.0)
pO2, Arterial: 420 mmHg — ABNORMAL HIGH (ref 83.0–108.0)
pO2, Arterial: 194 mmHg — ABNORMAL HIGH (ref 83.0–108.0)
pO2, Arterial: 289 mmHg — ABNORMAL HIGH (ref 83.0–108.0)
pO2, Arterial: 234 mmHg — ABNORMAL HIGH (ref 83.0–108.0)
pO2, Arterial: 271 mmHg — ABNORMAL HIGH (ref 83.0–108.0)
pO2, Arterial: 113 mmHg — ABNORMAL HIGH (ref 83.0–108.0)
pO2, Arterial: 117 mmHg — ABNORMAL HIGH (ref 83.0–108.0)
pO2, Arterial: 92 mmHg (ref 83.0–108.0)

## 2021-06-01 LAB — PROTIME-INR
INR: 1.7 — ABNORMAL HIGH (ref 0.8–1.2)
Prothrombin Time: 19.9 seconds — ABNORMAL HIGH (ref 11.4–15.2)

## 2021-06-01 LAB — CBC
HCT: 31.4 % — ABNORMAL LOW (ref 39.0–52.0)
Hemoglobin: 10.9 g/dL — ABNORMAL LOW (ref 13.0–17.0)
MCH: 31.7 pg (ref 26.0–34.0)
MCHC: 34.7 g/dL (ref 30.0–36.0)
MCV: 91.3 fL (ref 80.0–100.0)
Platelets: 84 10*3/uL — ABNORMAL LOW (ref 150–400)
RBC: 3.44 MIL/uL — ABNORMAL LOW (ref 4.22–5.81)
RDW: 12.6 % (ref 11.5–15.5)
WBC: 7.8 10*3/uL (ref 4.0–10.5)
nRBC: 0 % (ref 0.0–0.2)

## 2021-06-01 LAB — PLATELET COUNT: Platelets: 108 10*3/uL — ABNORMAL LOW (ref 150–400)

## 2021-06-01 LAB — POCT I-STAT, CHEM 8
BUN: 14 mg/dL (ref 8–23)
BUN: 13 mg/dL (ref 8–23)
BUN: 12 mg/dL (ref 8–23)
BUN: 12 mg/dL (ref 8–23)
Calcium, Ion: 1.29 mmol/L (ref 1.15–1.40)
Calcium, Ion: 1.24 mmol/L (ref 1.15–1.40)
Calcium, Ion: 1 mmol/L — ABNORMAL LOW (ref 1.15–1.40)
Calcium, Ion: 1.06 mmol/L — ABNORMAL LOW (ref 1.15–1.40)
Chloride: 104 mmol/L (ref 98–111)
Chloride: 104 mmol/L (ref 98–111)
Chloride: 102 mmol/L (ref 98–111)
Chloride: 103 mmol/L (ref 98–111)
Creatinine, Ser: 1.3 mg/dL — ABNORMAL HIGH (ref 0.61–1.24)
Creatinine, Ser: 1.3 mg/dL — ABNORMAL HIGH (ref 0.61–1.24)
Creatinine, Ser: 1 mg/dL (ref 0.61–1.24)
Creatinine, Ser: 1.2 mg/dL (ref 0.61–1.24)
Glucose, Bld: 105 mg/dL — ABNORMAL HIGH (ref 70–99)
Glucose, Bld: 99 mg/dL (ref 70–99)
Glucose, Bld: 89 mg/dL (ref 70–99)
Glucose, Bld: 121 mg/dL — ABNORMAL HIGH (ref 70–99)
HCT: 37 % — ABNORMAL LOW (ref 39.0–52.0)
HCT: 34 % — ABNORMAL LOW (ref 39.0–52.0)
HCT: 26 % — ABNORMAL LOW (ref 39.0–52.0)
HCT: 25 % — ABNORMAL LOW (ref 39.0–52.0)
Hemoglobin: 12.6 g/dL — ABNORMAL LOW (ref 13.0–17.0)
Hemoglobin: 11.6 g/dL — ABNORMAL LOW (ref 13.0–17.0)
Hemoglobin: 8.8 g/dL — ABNORMAL LOW (ref 13.0–17.0)
Hemoglobin: 8.5 g/dL — ABNORMAL LOW (ref 13.0–17.0)
Potassium: 3.8 mmol/L (ref 3.5–5.1)
Potassium: 4 mmol/L (ref 3.5–5.1)
Potassium: 4.7 mmol/L (ref 3.5–5.1)
Potassium: 4 mmol/L (ref 3.5–5.1)
Sodium: 142 mmol/L (ref 135–145)
Sodium: 141 mmol/L (ref 135–145)
Sodium: 137 mmol/L (ref 135–145)
Sodium: 139 mmol/L (ref 135–145)
TCO2: 29 mmol/L (ref 22–32)
TCO2: 26 mmol/L (ref 22–32)
TCO2: 26 mmol/L (ref 22–32)
TCO2: 25 mmol/L (ref 22–32)

## 2021-06-01 LAB — GLUCOSE, CAPILLARY
Glucose-Capillary: 117 mg/dL — ABNORMAL HIGH (ref 70–99)
Glucose-Capillary: 115 mg/dL — ABNORMAL HIGH (ref 70–99)
Glucose-Capillary: 114 mg/dL — ABNORMAL HIGH (ref 70–99)
Glucose-Capillary: 110 mg/dL — ABNORMAL HIGH (ref 70–99)
Glucose-Capillary: 137 mg/dL — ABNORMAL HIGH (ref 70–99)

## 2021-06-01 LAB — APTT: aPTT: 40 seconds — ABNORMAL HIGH (ref 24–36)

## 2021-06-01 LAB — HEMOGLOBIN AND HEMATOCRIT, BLOOD
HCT: 26.9 % — ABNORMAL LOW (ref 39.0–52.0)
Hemoglobin: 9.3 g/dL — ABNORMAL LOW (ref 13.0–17.0)

## 2021-06-01 LAB — ABO/RH: ABO/RH(D): O POS

## 2021-06-01 LAB — BASIC METABOLIC PANEL
Anion gap: 9 (ref 5–15)
BUN: 11 mg/dL (ref 8–23)
CO2: 22 mmol/L (ref 22–32)
Calcium: 7.6 mg/dL — ABNORMAL LOW (ref 8.9–10.3)
Chloride: 105 mmol/L (ref 98–111)
Creatinine, Ser: 1.29 mg/dL — ABNORMAL HIGH (ref 0.61–1.24)
GFR, Estimated: 59 mL/min — ABNORMAL LOW (ref >60)
Glucose, Bld: 169 mg/dL — ABNORMAL HIGH (ref 70–99)
Potassium: 4.3 mmol/L (ref 3.5–5.1)
Sodium: 136 mmol/L (ref 135–145)

## 2021-06-01 LAB — MAGNESIUM: Magnesium: 2.4 mg/dL (ref 1.7–2.4)

## 2021-06-01 SURGERY — CORONARY ARTERY BYPASS GRAFTING (CABG)
Anesthesia: General | Site: Esophagus | Laterality: Right

## 2021-06-01 MED ORDER — FENTANYL CITRATE (PF) 250 MCG/5ML IJ SOLN
INTRAMUSCULAR | Status: AC
  Filled 2021-06-01: qty 5

## 2021-06-01 MED ORDER — THROMBIN 20000 UNITS EX SOLR
CUTANEOUS | Status: DC | PRN
  Administered 2021-06-01: 20000 [IU] via TOPICAL

## 2021-06-01 MED ORDER — OXYCODONE HCL 5 MG PO TABS
5.0000 mg | ORAL_TABLET | ORAL | Status: DC | PRN
  Administered 2021-06-02 (×2): 10 mg via ORAL
  Filled 2021-06-01 (×2): qty 2

## 2021-06-01 MED ORDER — FENTANYL CITRATE (PF) 250 MCG/5ML IJ SOLN
INTRAMUSCULAR | Status: DC | PRN
  Administered 2021-06-01 (×3): 100 ug via INTRAVENOUS
  Administered 2021-06-01: 50 ug via INTRAVENOUS
  Administered 2021-06-01 (×3): 100 ug via INTRAVENOUS
  Administered 2021-06-01: 50 ug via INTRAVENOUS
  Administered 2021-06-01: 150 ug via INTRAVENOUS
  Administered 2021-06-01 (×4): 100 ug via INTRAVENOUS
  Administered 2021-06-01: 150 ug via INTRAVENOUS
  Administered 2021-06-01 (×2): 50 ug via INTRAVENOUS

## 2021-06-01 MED ORDER — PROPOFOL 10 MG/ML IV BOLUS
INTRAVENOUS | Status: DC | PRN
  Administered 2021-06-01: 40 mg via INTRAVENOUS
  Administered 2021-06-01: 20 mg via INTRAVENOUS

## 2021-06-01 MED ORDER — ~~LOC~~ CARDIAC SURGERY, PATIENT & FAMILY EDUCATION
Freq: Once | Status: DC
  Filled 2021-06-01: qty 1

## 2021-06-01 MED ORDER — POTASSIUM CHLORIDE 10 MEQ/50ML IV SOLN
10.0000 meq | INTRAVENOUS | Status: AC
  Administered 2021-06-01 (×3): 10 meq via INTRAVENOUS
  Filled 2021-06-01 (×2): qty 50

## 2021-06-01 MED ORDER — LEVOTHYROXINE SODIUM 50 MCG PO TABS
50.0000 ug | ORAL_TABLET | Freq: Every day | ORAL | Status: DC
  Administered 2021-06-02 – 2021-06-06 (×5): 50 ug via ORAL
  Filled 2021-06-01 (×5): qty 1

## 2021-06-01 MED ORDER — LACTATED RINGERS IV SOLN: INTRAVENOUS | Status: DC

## 2021-06-01 MED ORDER — METOPROLOL TARTRATE 12.5 MG HALF TABLET
12.5000 mg | ORAL_TABLET | Freq: Two times a day (BID) | ORAL | Status: DC
  Administered 2021-06-02 (×2): 12.5 mg via ORAL
  Filled 2021-06-01 (×2): qty 1

## 2021-06-01 MED ORDER — PROTAMINE SULFATE 10 MG/ML IV SOLN
INTRAVENOUS | Status: AC
  Filled 2021-06-01: qty 15

## 2021-06-01 MED ORDER — PANTOPRAZOLE SODIUM 40 MG PO TBEC: 40.0000 mg | DELAYED_RELEASE_TABLET | Freq: Every day | ORAL | Status: DC

## 2021-06-01 MED ORDER — MORPHINE SULFATE (PF) 2 MG/ML IV SOLN
1.0000 mg | INTRAVENOUS | Status: DC | PRN
  Administered 2021-06-01 – 2021-06-02 (×2): 2 mg via INTRAVENOUS
  Filled 2021-06-01 (×2): qty 1

## 2021-06-01 MED ORDER — NITROGLYCERIN IN D5W 200-5 MCG/ML-% IV SOLN: 0.0000 ug/min | INTRAVENOUS | Status: DC

## 2021-06-01 MED ORDER — MAGNESIUM SULFATE 4 GM/100ML IV SOLN
4.0000 g | Freq: Once | INTRAVENOUS | Status: AC
  Administered 2021-06-01: 4 g via INTRAVENOUS
  Filled 2021-06-01: qty 100

## 2021-06-01 MED ORDER — HEPARIN SODIUM (PORCINE) 1000 UNIT/ML IJ SOLN
INTRAMUSCULAR | Status: DC | PRN
  Administered 2021-06-01: 27000 [IU] via INTRAVENOUS

## 2021-06-01 MED ORDER — BISACODYL 5 MG PO TBEC
10.0000 mg | DELAYED_RELEASE_TABLET | Freq: Every day | ORAL | Status: DC
  Administered 2021-06-02 – 2021-06-05 (×4): 10 mg via ORAL
  Filled 2021-06-01 (×5): qty 2

## 2021-06-01 MED ORDER — SODIUM CHLORIDE 0.45 % IV SOLN: INTRAVENOUS | Status: DC | PRN

## 2021-06-01 MED ORDER — PLASMA-LYTE A IV SOLN
INTRAVENOUS | Status: DC | PRN
  Administered 2021-06-01: 1000 mL

## 2021-06-01 MED ORDER — ACETAMINOPHEN 160 MG/5ML PO SOLN: 1000.0000 mg | Freq: Four times a day (QID) | ORAL | Status: DC

## 2021-06-01 MED ORDER — HEPARIN SODIUM (PORCINE) 1000 UNIT/ML IJ SOLN
INTRAMUSCULAR | Status: AC
  Filled 2021-06-01: qty 2

## 2021-06-01 MED ORDER — SODIUM CHLORIDE 0.9 % IV SOLN: 250.0000 mL | INTRAVENOUS | Status: DC

## 2021-06-01 MED ORDER — ASPIRIN 81 MG PO CHEW: 324.0000 mg | CHEWABLE_TABLET | Freq: Every day | ORAL | Status: DC

## 2021-06-01 MED ORDER — THROMBIN (RECOMBINANT) 20000 UNITS EX SOLR
CUTANEOUS | Status: AC
  Filled 2021-06-01: qty 20000

## 2021-06-01 MED ORDER — DEXMEDETOMIDINE HCL IN NACL 400 MCG/100ML IV SOLN
0.0000 ug/kg/h | INTRAVENOUS | Status: DC
  Administered 2021-06-01: 0.4 ug/kg/h via INTRAVENOUS
  Filled 2021-06-01: qty 100

## 2021-06-01 MED ORDER — METOPROLOL TARTRATE 25 MG/10 ML ORAL SUSPENSION: 12.5000 mg | Freq: Two times a day (BID) | ORAL | Status: DC

## 2021-06-01 MED ORDER — THROMBIN 20000 UNITS EX SOLR
CUTANEOUS | Status: DC | PRN
  Administered 2021-06-01 (×2): 4 mL via TOPICAL

## 2021-06-01 MED ORDER — ACETAMINOPHEN 650 MG RE SUPP
650.0000 mg | Freq: Once | RECTAL | Status: AC
  Administered 2021-06-01: 650 mg via RECTAL

## 2021-06-01 MED ORDER — ALBUMIN HUMAN 5 % IV SOLN
250.0000 mL | INTRAVENOUS | Status: AC | PRN
  Administered 2021-06-01 (×2): 12.5 g via INTRAVENOUS

## 2021-06-01 MED ORDER — BISACODYL 10 MG RE SUPP: 10.0000 mg | Freq: Every day | RECTAL | Status: DC

## 2021-06-01 MED ORDER — PROTAMINE SULFATE 10 MG/ML IV SOLN
INTRAVENOUS | Status: DC | PRN
  Administered 2021-06-01: 20 mg via INTRAVENOUS
  Administered 2021-06-01: 50 mg via INTRAVENOUS
  Administered 2021-06-01: 250 mg via INTRAVENOUS

## 2021-06-01 MED ORDER — ALBUMIN HUMAN 5 % IV SOLN: INTRAVENOUS | Status: DC | PRN

## 2021-06-01 MED ORDER — SODIUM CHLORIDE 0.9% FLUSH
3.0000 mL | Freq: Two times a day (BID) | INTRAVENOUS | Status: DC
  Administered 2021-06-02 – 2021-06-03 (×3): 3 mL via INTRAVENOUS

## 2021-06-01 MED ORDER — ROSUVASTATIN CALCIUM 20 MG PO TABS
40.0000 mg | ORAL_TABLET | Freq: Every day | ORAL | Status: DC
  Administered 2021-06-02: 40 mg via ORAL
  Filled 2021-06-01: qty 2

## 2021-06-01 MED ORDER — CEFAZOLIN SODIUM-DEXTROSE 2-4 GM/100ML-% IV SOLN
2.0000 g | Freq: Three times a day (TID) | INTRAVENOUS | Status: AC
  Administered 2021-06-01 – 2021-06-03 (×6): 2 g via INTRAVENOUS
  Filled 2021-06-01 (×5): qty 100

## 2021-06-01 MED ORDER — PROTAMINE SULFATE 10 MG/ML IV SOLN
INTRAVENOUS | Status: AC
  Filled 2021-06-01: qty 25

## 2021-06-01 MED ORDER — 0.9 % SODIUM CHLORIDE (POUR BTL) OPTIME
TOPICAL | Status: DC | PRN
  Administered 2021-06-01: 5000 mL

## 2021-06-01 MED ORDER — ROCURONIUM BROMIDE 10 MG/ML (PF) SYRINGE
PREFILLED_SYRINGE | INTRAVENOUS | Status: DC | PRN
  Administered 2021-06-01: 70 mg via INTRAVENOUS
  Administered 2021-06-01: 50 mg via INTRAVENOUS
  Administered 2021-06-01 (×2): 30 mg via INTRAVENOUS

## 2021-06-01 MED ORDER — MIDAZOLAM HCL (PF) 10 MG/2ML IJ SOLN
INTRAMUSCULAR | Status: AC
  Filled 2021-06-01: qty 2

## 2021-06-01 MED ORDER — PROPOFOL 10 MG/ML IV BOLUS
INTRAVENOUS | Status: AC
  Filled 2021-06-01: qty 20

## 2021-06-01 MED ORDER — DEXTROSE 50 % IV SOLN: 0.0000 mL | INTRAVENOUS | Status: DC | PRN

## 2021-06-01 MED ORDER — ONDANSETRON HCL 4 MG/2ML IJ SOLN
4.0000 mg | Freq: Four times a day (QID) | INTRAMUSCULAR | Status: DC | PRN
  Administered 2021-06-01 – 2021-06-06 (×5): 4 mg via INTRAVENOUS
  Filled 2021-06-01 (×5): qty 2

## 2021-06-01 MED ORDER — INSULIN REGULAR(HUMAN) IN NACL 100-0.9 UT/100ML-% IV SOLN: INTRAVENOUS | Status: DC

## 2021-06-01 MED ORDER — METOPROLOL TARTRATE 5 MG/5ML IV SOLN: 2.5000 mg | INTRAVENOUS | Status: DC | PRN

## 2021-06-01 MED ORDER — TRAMADOL HCL 50 MG PO TABS
50.0000 mg | ORAL_TABLET | ORAL | Status: DC | PRN
  Administered 2021-06-02 – 2021-06-04 (×3): 50 mg via ORAL
  Filled 2021-06-01 (×3): qty 1

## 2021-06-01 MED ORDER — LACTATED RINGERS IV SOLN: INTRAVENOUS | Status: DC | PRN

## 2021-06-01 MED ORDER — HEMOSTATIC AGENTS (NO CHARGE) OPTIME
TOPICAL | Status: DC | PRN
  Administered 2021-06-01 (×2): 1 via TOPICAL

## 2021-06-01 MED ORDER — METOPROLOL TARTRATE 12.5 MG HALF TABLET
12.5000 mg | ORAL_TABLET | Freq: Once | ORAL | Status: DC
  Filled 2021-06-01: qty 1

## 2021-06-01 MED ORDER — PHENYLEPHRINE HCL-NACL 20-0.9 MG/250ML-% IV SOLN: 0.0000 ug/min | INTRAVENOUS | Status: DC

## 2021-06-01 MED ORDER — CHLORHEXIDINE GLUCONATE 4 % EX LIQD: 30.0000 mL | CUTANEOUS | Status: DC

## 2021-06-01 MED ORDER — CHLORHEXIDINE GLUCONATE CLOTH 2 % EX PADS
6.0000 | MEDICATED_PAD | Freq: Every day | CUTANEOUS | Status: DC
  Administered 2021-06-01 – 2021-06-05 (×5): 6 via TOPICAL

## 2021-06-01 MED ORDER — FAMOTIDINE IN NACL 20-0.9 MG/50ML-% IV SOLN
20.0000 mg | Freq: Two times a day (BID) | INTRAVENOUS | Status: AC
  Administered 2021-06-01 (×2): 20 mg via INTRAVENOUS
  Filled 2021-06-01 (×2): qty 50

## 2021-06-01 MED ORDER — SODIUM CHLORIDE 0.9% FLUSH: 3.0000 mL | INTRAVENOUS | Status: DC | PRN

## 2021-06-01 MED ORDER — VANCOMYCIN HCL IN DEXTROSE 1-5 GM/200ML-% IV SOLN
1000.0000 mg | Freq: Once | INTRAVENOUS | Status: AC
  Administered 2021-06-01: 1000 mg via INTRAVENOUS
  Filled 2021-06-01: qty 200

## 2021-06-01 MED ORDER — DOCUSATE SODIUM 100 MG PO CAPS
200.0000 mg | ORAL_CAPSULE | Freq: Every day | ORAL | Status: DC
  Administered 2021-06-02 – 2021-06-05 (×4): 200 mg via ORAL
  Filled 2021-06-01 (×5): qty 2

## 2021-06-01 MED ORDER — MIDAZOLAM HCL 2 MG/2ML IJ SOLN
2.0000 mg | INTRAMUSCULAR | Status: DC | PRN
  Administered 2021-06-01: 2 mg via INTRAVENOUS
  Filled 2021-06-01: qty 2

## 2021-06-01 MED ORDER — POTASSIUM CHLORIDE 10 MEQ/50ML IV SOLN
10.0000 meq | INTRAVENOUS | Status: AC
  Administered 2021-06-01 (×2): 10 meq via INTRAVENOUS

## 2021-06-01 MED ORDER — LACTATED RINGERS IV SOLN: 500.0000 mL | Freq: Once | INTRAVENOUS | Status: DC | PRN

## 2021-06-01 MED ORDER — SODIUM CHLORIDE 0.9 % IV SOLN: INTRAVENOUS | Status: DC

## 2021-06-01 MED ORDER — TAMSULOSIN HCL 0.4 MG PO CAPS
0.4000 mg | ORAL_CAPSULE | Freq: Every day | ORAL | Status: DC
  Administered 2021-06-02 – 2021-06-06 (×5): 0.4 mg via ORAL
  Filled 2021-06-01 (×5): qty 1

## 2021-06-01 MED ORDER — ACETAMINOPHEN 500 MG PO TABS
1000.0000 mg | ORAL_TABLET | Freq: Four times a day (QID) | ORAL | Status: DC
  Administered 2021-06-02 – 2021-06-06 (×18): 1000 mg via ORAL
  Filled 2021-06-01 (×18): qty 2

## 2021-06-01 MED ORDER — MIDAZOLAM HCL (PF) 5 MG/ML IJ SOLN
INTRAMUSCULAR | Status: DC | PRN
Start: 2021-06-01 — End: 2021-06-01
  Administered 2021-06-01: 2 mg via INTRAVENOUS
  Administered 2021-06-01: 1 mg via INTRAVENOUS
  Administered 2021-06-01 (×2): 2 mg via INTRAVENOUS

## 2021-06-01 MED ORDER — INSULIN ASPART 100 UNIT/ML IJ SOLN
0.0000 [IU] | INTRAMUSCULAR | Status: DC
Start: 2021-06-01 — End: 2021-06-03
  Administered 2021-06-01 – 2021-06-03 (×7): 2 [IU] via SUBCUTANEOUS

## 2021-06-01 MED ORDER — ACETAMINOPHEN 160 MG/5ML PO SOLN: 650.0000 mg | Freq: Once | ORAL | Status: AC

## 2021-06-01 MED ORDER — CHLORHEXIDINE GLUCONATE 0.12 % MT SOLN
15.0000 mL | Freq: Once | OROMUCOSAL | Status: AC
  Administered 2021-06-01: 15 mL via OROMUCOSAL
  Filled 2021-06-01: qty 15

## 2021-06-01 MED ORDER — ASPIRIN EC 325 MG PO TBEC
325.0000 mg | DELAYED_RELEASE_TABLET | Freq: Every day | ORAL | Status: DC
  Administered 2021-06-02 – 2021-06-04 (×3): 325 mg via ORAL
  Filled 2021-06-01 (×3): qty 1

## 2021-06-01 MED ORDER — CHLORHEXIDINE GLUCONATE 0.12 % MT SOLN
15.0000 mL | OROMUCOSAL | Status: AC
  Administered 2021-06-01: 15 mL via OROMUCOSAL
  Filled 2021-06-01: qty 15

## 2021-06-01 SURGICAL SUPPLY — 106 items
ADH SKN CLS APL DERMABOND .7 (GAUZE/BANDAGES/DRESSINGS) ×4
BAG DECANTER FOR FLEXI CONT (MISCELLANEOUS) ×6 IMPLANT
BLADE CLIPPER SURG (BLADE) ×6 IMPLANT
BLADE STERNUM SYSTEM 6 (BLADE) ×6 IMPLANT
BNDG ELASTIC 4X5.8 VLCR STR LF (GAUZE/BANDAGES/DRESSINGS) ×6 IMPLANT
BNDG ELASTIC 6X5.8 VLCR STR LF (GAUZE/BANDAGES/DRESSINGS) ×6 IMPLANT
BNDG GAUZE ELAST 4 BULKY (GAUZE/BANDAGES/DRESSINGS) ×6 IMPLANT
CANISTER SUCT 3000ML PPV (MISCELLANEOUS) ×6 IMPLANT
CATH ROBINSON RED A/P 18FR (CATHETERS) ×12 IMPLANT
CATH THORACIC 28FR (CATHETERS) ×6 IMPLANT
CATH THORACIC 36FR (CATHETERS) ×6 IMPLANT
CATH THORACIC 36FR RT ANG (CATHETERS) ×6 IMPLANT
CLIP TI WIDE RED SMALL 24 (CLIP) ×2 IMPLANT
CLIP VESOCCLUDE MED 24/CT (CLIP) IMPLANT
CLIP VESOCCLUDE SM WIDE 24/CT (CLIP) IMPLANT
CONTAINER PROTECT SURGISLUSH (MISCELLANEOUS) ×12 IMPLANT
DERMABOND ADVANCED (GAUZE/BANDAGES/DRESSINGS) ×2
DERMABOND ADVANCED .7 DNX12 (GAUZE/BANDAGES/DRESSINGS) IMPLANT
DRAPE CARDIOVASCULAR INCISE (DRAPES) ×6
DRAPE SRG 135X102X78XABS (DRAPES) ×4 IMPLANT
DRAPE WARM FLUID 44X44 (DRAPES) ×6 IMPLANT
DRSG COVADERM 4X14 (GAUZE/BANDAGES/DRESSINGS) ×6 IMPLANT
ELECT CAUTERY BLADE 6.4 (BLADE) ×6 IMPLANT
ELECT REM PT RETURN 9FT ADLT (ELECTROSURGICAL) ×12
ELECTRODE REM PT RTRN 9FT ADLT (ELECTROSURGICAL) ×8 IMPLANT
FELT TEFLON 1X6 (MISCELLANEOUS) ×12 IMPLANT
GAUZE 4X4 16PLY ~~LOC~~+RFID DBL (SPONGE) ×2 IMPLANT
GAUZE SPONGE 4X4 12PLY STRL (GAUZE/BANDAGES/DRESSINGS) ×12 IMPLANT
GAUZE SPONGE 4X4 12PLY STRL LF (GAUZE/BANDAGES/DRESSINGS) ×4 IMPLANT
GLOVE SURG ENC MOIS LTX SZ6 (GLOVE) IMPLANT
GLOVE SURG ENC MOIS LTX SZ6.5 (GLOVE) IMPLANT
GLOVE SURG ENC MOIS LTX SZ7 (GLOVE) IMPLANT
GLOVE SURG ENC MOIS LTX SZ7.5 (GLOVE) IMPLANT
GLOVE SURG MICRO LTX SZ6 (GLOVE) ×20 IMPLANT
GLOVE SURG MICRO LTX SZ6.5 (GLOVE) ×10 IMPLANT
GLOVE SURG MICRO LTX SZ7 (GLOVE) ×14 IMPLANT
GLOVE SURG ORTHO LTX SZ7.5 (GLOVE) IMPLANT
GLOVE SURG UNDER POLY LF SZ6 (GLOVE) IMPLANT
GLOVE SURG UNDER POLY LF SZ6.5 (GLOVE) IMPLANT
GLOVE SURG UNDER POLY LF SZ7 (GLOVE) IMPLANT
GOWN STRL REUS W/ TWL LRG LVL3 (GOWN DISPOSABLE) ×16 IMPLANT
GOWN STRL REUS W/ TWL XL LVL3 (GOWN DISPOSABLE) ×4 IMPLANT
GOWN STRL REUS W/TWL LRG LVL3 (GOWN DISPOSABLE) ×60
GOWN STRL REUS W/TWL XL LVL3 (GOWN DISPOSABLE) ×6
HEMOSTAT POWDER SURGIFOAM 1G (HEMOSTASIS) ×18 IMPLANT
HEMOSTAT SURGICEL 2X14 (HEMOSTASIS) ×6 IMPLANT
INSERT FOGARTY 61MM (MISCELLANEOUS) IMPLANT
INSERT FOGARTY XLG (MISCELLANEOUS) IMPLANT
KIT BASIN OR (CUSTOM PROCEDURE TRAY) ×6 IMPLANT
KIT CATH CPB BARTLE (MISCELLANEOUS) ×6 IMPLANT
KIT SUCTION CATH 14FR (SUCTIONS) ×6 IMPLANT
KIT TURNOVER KIT B (KITS) ×6 IMPLANT
KIT VASOVIEW HEMOPRO 2 VH 4000 (KITS) ×6 IMPLANT
NS IRRIG 1000ML POUR BTL (IV SOLUTION) ×30 IMPLANT
PACK E OPEN HEART (SUTURE) ×6 IMPLANT
PACK OPEN HEART (CUSTOM PROCEDURE TRAY) ×6 IMPLANT
PAD ARMBOARD 7.5X6 YLW CONV (MISCELLANEOUS) ×12 IMPLANT
PAD ELECT DEFIB RADIOL ZOLL (MISCELLANEOUS) ×6 IMPLANT
PENCIL BUTTON HOLSTER BLD 10FT (ELECTRODE) ×6 IMPLANT
POSITIONER HEAD DONUT 9IN (MISCELLANEOUS) ×6 IMPLANT
PUNCH AORTIC ROTATE 4.0MM (MISCELLANEOUS) IMPLANT
PUNCH AORTIC ROTATE 4.5MM 8IN (MISCELLANEOUS) ×6 IMPLANT
PUNCH AORTIC ROTATE 5MM 8IN (MISCELLANEOUS) IMPLANT
SET MPS 3-ND DEL (MISCELLANEOUS) ×2 IMPLANT
SOL PREP POV-IOD 4OZ 10% (MISCELLANEOUS) ×4 IMPLANT
SOL PREP PROV IODINE SCRUB 4OZ (MISCELLANEOUS) ×4 IMPLANT
SPONGE INTESTINAL PEANUT (DISPOSABLE) IMPLANT
SPONGE T-LAP 18X18 ~~LOC~~+RFID (SPONGE) ×12 IMPLANT
SPONGE T-LAP 4X18 ~~LOC~~+RFID (SPONGE) ×6 IMPLANT
SUPPORT HEART JANKE-BARRON (MISCELLANEOUS) ×6 IMPLANT
SUT BONE WAX W31G (SUTURE) ×6 IMPLANT
SUT MNCRL AB 4-0 PS2 18 (SUTURE) ×2 IMPLANT
SUT PROLENE 3 0 SH DA (SUTURE) IMPLANT
SUT PROLENE 3 0 SH1 36 (SUTURE) ×6 IMPLANT
SUT PROLENE 4 0 RB 1 (SUTURE)
SUT PROLENE 4 0 SH DA (SUTURE) IMPLANT
SUT PROLENE 4-0 RB1 .5 CRCL 36 (SUTURE) IMPLANT
SUT PROLENE 5 0 C 1 36 (SUTURE) IMPLANT
SUT PROLENE 6 0 C 1 30 (SUTURE) IMPLANT
SUT PROLENE 7 0 BV 1 (SUTURE) ×12 IMPLANT
SUT PROLENE 7 0 BV1 MDA (SUTURE) ×8 IMPLANT
SUT PROLENE 8 0 BV175 6 (SUTURE) ×2 IMPLANT
SUT SILK  1 MH (SUTURE)
SUT SILK 1 MH (SUTURE) IMPLANT
SUT SILK 2 0 SH (SUTURE) IMPLANT
SUT STEEL 6MS V (SUTURE) ×4 IMPLANT
SUT STEEL STERNAL CCS#1 18IN (SUTURE) IMPLANT
SUT STEEL SZ 6 DBL 3X14 BALL (SUTURE) IMPLANT
SUT VIC AB 1 CTX 36 (SUTURE) ×12
SUT VIC AB 1 CTX36XBRD ANBCTR (SUTURE) ×8 IMPLANT
SUT VIC AB 2-0 CT1 27 (SUTURE) ×12
SUT VIC AB 2-0 CT1 TAPERPNT 27 (SUTURE) IMPLANT
SUT VIC AB 2-0 CTX 27 (SUTURE) IMPLANT
SUT VIC AB 3-0 SH 27 (SUTURE)
SUT VIC AB 3-0 SH 27X BRD (SUTURE) IMPLANT
SUT VIC AB 3-0 X1 27 (SUTURE) IMPLANT
SUT VICRYL 4-0 PS2 18IN ABS (SUTURE) IMPLANT
SYSTEM SAHARA CHEST DRAIN ATS (WOUND CARE) ×6 IMPLANT
TAPE CLOTH SURG 4X10 WHT LF (GAUZE/BANDAGES/DRESSINGS) ×2 IMPLANT
TAPE PAPER 2X10 WHT MICROPORE (GAUZE/BANDAGES/DRESSINGS) ×2 IMPLANT
TOWEL GREEN STERILE (TOWEL DISPOSABLE) ×6 IMPLANT
TOWEL GREEN STERILE FF (TOWEL DISPOSABLE) ×6 IMPLANT
TRAY FOLEY SLVR 16FR TEMP STAT (SET/KITS/TRAYS/PACK) ×6 IMPLANT
TUBING LAP HI FLOW INSUFFLATIO (TUBING) ×6 IMPLANT
UNDERPAD 30X36 HEAVY ABSORB (UNDERPADS AND DIAPERS) ×6 IMPLANT
WATER STERILE IRR 1000ML POUR (IV SOLUTION) ×12 IMPLANT

## 2021-06-01 NOTE — Anesthesia Procedure Notes (Signed)
Procedure Name: Intubation Date/Time: 06/01/2021 7:57 AM Performed by: Clearnce Sorrel, CRNA Pre-anesthesia Checklist: Patient identified, Emergency Drugs available, Suction available and Patient being monitored Patient Re-evaluated:Patient Re-evaluated prior to induction Oxygen Delivery Method: Circle System Utilized Preoxygenation: Pre-oxygenation with 100% oxygen Induction Type: IV induction Ventilation: Mask ventilation without difficulty Laryngoscope Size: Mac and 4 Grade View: Grade III Tube type: Oral Tube size: 8.0 mm Number of attempts: 1 Airway Equipment and Method: Stylet and Oral airway Placement Confirmation: positive ETCO2 and breath sounds checked- equal and bilateral Secured at: 23 cm Tube secured with: Tape Dental Injury: Teeth and Oropharynx as per pre-operative assessment

## 2021-06-01 NOTE — Discharge Instructions (Signed)

## 2021-06-01 NOTE — Anesthesia Procedure Notes (Signed)
Arterial Line Insertion Start/End12/02/2021 7:00 AM Performed by: Clearnce Sorrel, CRNA  Patient location: Pre-op. Preanesthetic checklist: patient identified, IV checked, site marked, risks and benefits discussed, surgical consent, monitors and equipment checked, pre-op evaluation, timeout performed and anesthesia consent Lidocaine 1% used for infiltration Left, radial was placed Catheter size: 20 G Hand hygiene performed  and maximum sterile barriers used   Attempts: 1 Procedure performed without using ultrasound guided technique. Following insertion, dressing applied. Post procedure assessment: normal and unchanged

## 2021-06-01 NOTE — Transfer of Care (Signed)
Immediate Anesthesia Transfer of Care Note  Patient: Cristian Cantrell  Procedure(s) Performed: CORONARY ARTERY BYPASS GRAFTING (CABG) X 4   USING LEFT INTERNAL MAMMARY ARTERY AND RIGHT ENDOSCOPIC GREATER SAPHENOUS VEIN CONDUITS (Chest) TRANSESOPHAGEAL ECHOCARDIOGRAM (TEE) (Esophagus) APPLICATION OF CELL SAVER ENDOVEIN HARVEST OF GREATER SAPHENOUS VEIN (Right)  Patient Location: SICU  Anesthesia Type:General  Level of Consciousness: Patient remains intubated per anesthesia plan  Airway & Oxygen Therapy: Patient remains intubated per anesthesia plan  Post-op Assessment: Report given to RN and Post -op Vital signs reviewed and stable  Post vital signs: Reviewed and stable  Last Vitals:  Vitals Value Taken Time  BP    Temp 36 C 06/01/21 1344  Pulse 87 06/01/21 1344  Resp 12 06/01/21 1344  SpO2 98 % 06/01/21 1344  Vitals shown include unvalidated device data.  Last Pain:  Vitals:   06/01/21 0608  TempSrc:   PainSc: 0-No pain         Complications: No notable events documented.

## 2021-06-01 NOTE — Progress Notes (Signed)
      [address]       [place] [address]             [phone]      S/p CABG x 4  Extubated  BP 108/74   Pulse 87   Temp 99.3 F (37.4 C)   Resp 12   Ht 6' (1.829 m)   Wt 81.6 kg   SpO2 97%   BMI 24.41 kg/m  22/16 CI=2.3   Intake/Output Summary (Last 24 hours) at 06/01/2021 1838 Last data filed at 06/01/2021 1800 Gross per 24 hour  Intake 3469.72 ml  Output 3682 ml  Net -212.28 ml   Minimal CT output  Hct 32, K= 3.4- supplementing  Doing well early postop  Remo Lipps C. Roxan Hockey, MD [facility] [phone]

## 2021-06-01 NOTE — Procedures (Signed)
Extubation Procedure Note  Patient Details:   Name: Cristian Cantrell DOB: 04/16/1951 MRN: [id number]   Airway Documentation:    Vent end date: 06/01/21 Vent end time: 1754   Evaluation  O2 sats: stable throughout Complications: No apparent complications Patient did tolerate procedure well. Bilateral Breath Sounds: Clear, Diminished   Yes  Patient tolerating extubation well. Patient has a cuff leak prior to extubation. Patient placed on 4L [place] with humidity. Patient able to speak and does not have any stridor at this time.   IS: 750 ml  Cristian Cantrell A Veronda Gabor 06/01/2021, 6:01 PM

## 2021-06-01 NOTE — Anesthesia Preprocedure Evaluation (Signed)
Anesthesia Evaluation  Patient identified by MRN, date of birth, ID band Patient awake    Reviewed: Allergy & Precautions, NPO status , Patient's Chart, lab work & pertinent test results  History of Anesthesia Complications (+) history of anesthetic complications  Airway Mallampati: III  TM Distance: <3 FB Neck ROM: Full    Dental  (+) Dental Advisory Given, Edentulous Upper   Pulmonary neg pulmonary ROS,    breath sounds clear to auscultation       Cardiovascular hypertension, Pt. on medications + angina  Rhythm:Regular  - Left ventricle: The cavity size was normal. Systolic function was  normal. The estimated ejection fraction was in the range of 60%  to 65%. Wall motion was normal; there were no regional wall  motion abnormalities. Left ventricular diastolic function  parameters were normal.  - Aortic valve: Trileaflet; normal thickness leaflets. There was no  regurgitation.  - Aortic root: The aortic root was normal in size.  - Right ventricle: The cavity size was normal. Wall thickness was  normal. Systolic function was normal.  - Tricuspid valve: There was trivial regurgitation.  - Pulmonic valve: There was no regurgitation.  - Pulmonary arteries: Systolic pressure was at the upper limits of  normal. PA peak pressure: 36 mm Hg (S).  - Inferior vena cava: The vessel was normal in size.  - Pericardium, extracardiac: There was no pericardial effusion.    LM: Normal LAD: Tortuous vessel          Prox 95% stenosis w/moderate calcification          Mid eccentric 80% stenosis w/moderate-severe calcification adjacent to medium sized diag 2, followed by a focal hazy 70% stenosis adjacent to small sized diag 3 Lcx: Prox focal 95% stenosis w/mild calcification        Distal Lcx occlusion (small vessel), left-to-left collaterals from distal LAD RCA: Prox 70%, followed by 90% stenosis adjacent to small sized RV  marginal that has ostial 95% stenosis  Intermediate complexity multivessel CAD, nondiabetic, normal LVEF Discuss CABG vs complex multivessel PCI for symptom improvement Will refer to CVTS Continue current medical management    Neuro/Psych negative neurological ROS  negative psych ROS   GI/Hepatic GERD  ,  Endo/Other  Hypothyroidism   Renal/GU Renal diseaseLab Results      Component                Value               Date                      CREATININE               1.20                06/01/2021                Musculoskeletal negative musculoskeletal ROS (+)   Abdominal   Peds  Hematology negative hematology ROS (+)   Anesthesia Other Findings   Reproductive/Obstetrics                             Anesthesia Physical Anesthesia Plan  ASA: 4  Anesthesia Plan: General   Post-op Pain Management:    Induction: Intravenous  PONV Risk Score and Plan: 2 and Treatment may vary due to age or medical condition  Airway Management Planned: Oral ETT  Additional Equipment: Arterial line, CVP, PA  Cath, TEE and Ultrasound Guidance Line Placement  Intra-op Plan:   Post-operative Plan: Post-operative intubation/ventilation  Informed Consent: I have reviewed the patients History and Physical, chart, labs and discussed the procedure including the risks, benefits and alternatives for the proposed anesthesia with the patient or authorized representative who has indicated his/her understanding and acceptance.     Dental advisory given  Plan Discussed with: CRNA and Anesthesiologist  Anesthesia Plan Comments:         Anesthesia Quick Evaluation

## 2021-06-01 NOTE — Anesthesia Procedure Notes (Signed)
Central Venous Catheter Insertion Performed by: Oleta Mouse, MD, anesthesiologist Start/End12/02/2021 7:08 AM, 06/01/2021 7:18 AM Patient location: Pre-op. Preanesthetic checklist: patient identified, IV checked, site marked, risks and benefits discussed, surgical consent, monitors and equipment checked, pre-op evaluation, timeout performed and anesthesia consent Lidocaine 1% used for infiltration and patient sedated Hand hygiene performed  and maximum sterile barriers used  Catheter size: 8.5 Fr Sheath introducer Procedure performed using ultrasound guided technique. Ultrasound Notes:anatomy identified, needle tip was noted to be adjacent to the nerve/plexus identified, no ultrasound evidence of intravascular and/or intraneural injection and image(s) printed for medical record Attempts: 1 Following insertion, line sutured, dressing applied and Biopatch. Post procedure assessment: blood return through all ports, free fluid flow and no air  Patient tolerated the procedure well with no immediate complications.

## 2021-06-01 NOTE — Interval H&P Note (Signed)
History and Physical Interval Note:  06/01/2021 7:05 AM  Cristian Cantrell  has presented today for surgery, with the diagnosis of CAD.  The various methods of treatment have been discussed with the patient and family. After consideration of risks, benefits and other options for treatment, the patient has consented to  Procedure(s): CORONARY ARTERY BYPASS GRAFTING (CABG) (N/A) TRANSESOPHAGEAL ECHOCARDIOGRAM (TEE) (N/A) as a surgical intervention.  The patient's history has been reviewed, patient examined, no change in status, stable for surgery.  I have reviewed the patient's chart and labs.  Questions were answered to the patient's satisfaction.     Gaye Pollack

## 2021-06-01 NOTE — Brief Op Note (Signed)
06/01/2021  11:14 AM  PATIENT:  Cristian Cantrell  70 y.o. male  PRE-OPERATIVE DIAGNOSIS:  Coronary artery disease  POST-OPERATIVE DIAGNOSIS:  Coronary artery disease  PROCEDURE:  TRANSESOPHAGEAL ECHOCARDIOGRAM (TEE), CORONARY ARTERY BYPASS GRAFTING (CABG) X 4   (LIMA to LAD, SVG to DIAGONAL, SVG to OM, SVG to PDA) USING LEFT INTERNAL MAMMARY ARTERY AND RIGHT  GREATER SAPHENOUS VEIN HARVESTED ENDOSCOPICALLY, APPLICATION OF CELL SAVER  SURGEON:  Surgeon(s) and Role:    Gaye Pollack, MD - Primary  PHYSICIAN ASSISTANT: Lars Pinks PA-C  ASSISTANTS: Despina Arias RNFA   ANESTHESIA:   general  EBL:  Per perfusion, anesthesia record  DRAINS:  Chest tubes placed in the mediastinal and pleural spaces    COUNTS CORRECT:  YES  DICTATION: .Dragon Dictation  PLAN OF CARE: Admit to inpatient   PATIENT DISPOSITION:  ICU - intubated and hemodynamically stable.   Delay start of Pharmacological VTE agent (>24hrs) due to surgical blood loss or risk of bleeding: yes  BASELINE WEIGHT: 81.6 kg

## 2021-06-01 NOTE — Progress Notes (Signed)
Patient placed back in full support due to the patient's respiratory rate being low.

## 2021-06-01 NOTE — Progress Notes (Signed)
EKG CRITICAL VALUE     12 lead EKG performed.  Critical value noted.  Shade Flood, RN notified.   Willaim Rayas, CCT 06/01/2021 1:59 PM

## 2021-06-01 NOTE — Anesthesia Procedure Notes (Signed)
Central Venous Catheter Insertion Performed by: Oleta Mouse, MD, anesthesiologist Start/End12/02/2021 7:08 AM, 06/01/2021 7:18 AM Patient location: Pre-op. Preanesthetic checklist: patient identified, IV checked, site marked, risks and benefits discussed, surgical consent, monitors and equipment checked, pre-op evaluation, timeout performed and anesthesia consent Hand hygiene performed  and maximum sterile barriers used  PA cath was placed.Swan type:thermodilution Procedure performed without using ultrasound guided technique. Attempts: 1 Patient tolerated the procedure well with no immediate complications.

## 2021-06-01 NOTE — Progress Notes (Signed)
VC: 800 ml NIF: -30  Patient performed with great patient effort.

## 2021-06-01 NOTE — Discharge Summary (Signed)
Physician Discharge Summary       [address]       [place] [address]             [phone]    Patient ID: Cristian Cantrell MRN: [id number] DOB/AGE: [birth year] 70 y.o.  Admit date: 06/01/2021 Discharge date: 06/06/2021  Admission Diagnoses: Coronary artery disease Discharge Diagnoses:  S/p CABG x 2 2. Expected post op blood loss anemia 3. 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   Coronary artery bypass grafting x 4   Left internal mammary artery graft to the LAD SVG  to diagonal SVG to OM SVG to PDA 4.   Endoscopic vein harvest from the right leg by Dr. Cyndia Bent on 06/01/2021.   History of Presenting Illness: The patient is a 70 year old gentleman with a history of hypertension, hyperlipidemia, GERD, and hypothyroidism who presents with a 1 to 44-month history of substernal chest tightness and shortness of breath with exertion.  He underwent a nuclear stress test which was an intermediate risk study showing a medium sized, medium intensity, partially reversible perfusion defect in the apical to basal inferior and inferoseptal myocardium.  Ejection fraction was 50%.  He had no chest pain during the test but did have some shortness of breath.  Electrocardiogram showed ST depression in leads V5 and V6.  A 2D echocardiogram showed normal LV systolic function with ejection fraction of 61%.  There is no significant valvular abnormality with trace aortic insufficiency.  He underwent cardiac catheterization on 05/15/2021 showing severe three-vessel coronary disease. Dr. Cyndia Bent  discussed the need for coronary artery bypass grafting surgery. Potential risks, benefits, and complications of the surgery were discussed with the patient and he agreed to proceed. Pre operative carotid duplex US showed no significant internal carotid artery stenosis bilaterally.  Brief Hospital Course:  Patient underwent a CABG x 4. He was transferred from the OR to [facility] ICU in stable condition.  Vital signs, cardiac rhythm, and hemodynamics remained stable.  He was weaned from mechanical ventilation and extubated by 6 PM on the day of surgery.  The chest tubes and monitoring lines were removed routinely on the first postoperative day.  He was started on aspirin, beta-blocker, and statin.  Glucose was monitored and sliding scale insulin provided as required.  He was noted to have some gastric distention on the first postoperative day and was treated with IV Reglan.  He was mobilized and progressed well with ambulation.  On the second postoperative day, he was noted to have thrombocytopenia with a platelet count of 90,000.  The enoxaparin was discontinued at that point.  He was otherwise stable and felt ready for transfer to 4E progressive care He was volume overloaded and diuresed accordingly. He has a history of CKD (IIIA). His creatinine post op went up to 1.5. His pre op creatinine was 1.54. He will be started on Plavix and ec asa will be decreased to 81 mg after pacer wire removal. Bystolic was increased to 10  mg daily for better BP and HR control. He has been ambulating on room air with good oxygenation. He has been tolerating a diet and has had a bowel movement. His wounds are clean, dry, and healing without signs of infection.   Latest Vital Signs: Blood pressure 127/84, pulse 86, temperature 98.4 F (36.9 C), temperature source Oral, resp. rate 14, height 6' (1.829 m), weight 88.3 kg, SpO2 98 %.  Physical Exam: Cardiovascular: RRR Pulmonary: Clear to auscultation bilaterally; Abdomen: Soft, non  tender, bowel sounds present. Extremities: Mild bilateral lower extremity edema. Wounds: Sternal dressing removed and wound is clean and dry.  No erythema or signs of infection. RLE wounds are clean, dry.  Discharge Condition:Stable and discharged to home.  Recent laboratory studies:  Lab Results  Component Value Date   WBC 8.2 06/04/2021   HGB 10.7 (L) 06/04/2021   HCT 31.5 (L) 06/04/2021   MCV 93.2 06/04/2021   PLT 92 (L) 06/04/2021   Lab Results  Component Value Date   NA 136 06/05/2021   K 4.1 06/05/2021   CL 102 06/05/2021   CO2 27 06/05/2021   CREATININE 1.50 (H) 06/05/2021   GLUCOSE 116 (H) 06/05/2021      Diagnostic Studies: DG Chest 2 View  Result Date: 06/04/2021 CLINICAL DATA:  Status post coronary bypass graft. EXAM: CHEST - 2 VIEW COMPARISON:  June 03, 2021. FINDINGS: Stable cardiomediastinal silhouette. Status post coronary bypass graft. No pneumothorax is noted. Small bilateral pleural effusions are noted. Mild left basilar subsegmental atelectasis is noted. Bony thorax is unremarkable. IMPRESSION: Mild left basilar subsegmental atelectasis. Small bilateral pleural effusions. Electronically Signed   By: Marijo Conception M.D.   On: 06/04/2021 08:53   DG Chest 2 View  Result Date: 05/30/2021 CLINICAL DATA:  Preop evaluation for upcoming coronary bypass grafting EXAM: CHEST - 2 VIEW COMPARISON:  None. FINDINGS: Cardiac shadow is within normal limits. Mild aortic calcifications are noted. The lungs are clear bilaterally. No acute bony abnormality is seen. IMPRESSION: No acute abnormality noted. Electronically Signed   By: Inez Catalina M.D.   On: 05/30/2021 22:47   CARDIAC CATHETERIZATION  Addendum Date: 05/15/2021   LM: Normal LAD: Tortuous vessel          Prox 95% stenosis w/moderate calcification          Mid eccentric 80% stenosis w/moderate-severe calcification adjacent to medium sized diag 2, followed by a focal hazy 70% stenosis adjacent to small sized diag 3  Lcx: Prox focal 95% stenosis w/mild calcification        Distal Lcx occlusion (small vessel), left-to-left collaterals from distal LAD RCA: Prox 70%, followed by 90% stenosis adjacent to small sized RV marginal that has ostial 95% stenosis Intermediate complexity multivessel CAD, nondiabetic, normal LVEF Discuss CABG vs complex multivessel PCI for symptom improvement Will refer to CVTS Continue current medical management Nigel Mormon, MD Pager: [phone] Office: [phone]   Result Date: 05/15/2021 Images from the original result were not included. LM: Normal LAD: Tortuous vessel          Prox 95% stenosis w/moderate calcification          Mid eccentric 80% stenosis w/moderate-severe calcification adjacent to medium sized diag 2, followed by a focal hazy 70% stenosis adjacent to small sized diag 3 Lcx: Prox focal 95% stenosis w/mild calcification        Distal Lcx occlusion (small vessel), left-to-left collaterals from distal LAD RCA: Prox 70%, followed by 90% stenosis adjacent to small  sized RV marginal that has ostial 95% stenosis Intermediate complexity multivessel CAD, nondiabetic, normal LVEF Discuss CABG vs complex multivessel PCI for symptom improvement Will refer to CVTS Continue current medical management Nigel Mormon, MD Pager: [phone] Office: [phone]  DG Chest Port 1 View  Result Date: 06/03/2021 CLINICAL DATA:  Status post coronary artery bypass graft EXAM: PORTABLE CHEST 1 VIEW COMPARISON:  Prior chest x-ray yesterday 04/02/2021 FINDINGS: Interval removal of right IJ Cordis sheath and Swan-Ganz catheter as well as mediastinal and left-sided chest tubes. Surgical changes of median sternotomy for multivessel CABG. Trace left apical pneumothorax. Mild bibasilar atelectasis. No pulmonary edema. Atherosclerotic calcifications present in the transverse aorta. No acute osseous abnormality. IMPRESSION: 1. Trace left apical pneumothorax. 2. Interval removal of right IJ Cordis  sheath, Swan-Ganz catheter, mediastinal drain and left-sided chest tube. 3. Mild bibasilar atelectasis. 4. Aortic atherosclerosis. Electronically Signed   By: Jacqulynn Cadet M.D.   On: 06/03/2021 07:40   DG Chest Port 1 View  Result Date: 06/02/2021 CLINICAL DATA:  Pneumothorax. EXAM: PORTABLE CHEST 1 VIEW COMPARISON:  June 01, 2021. FINDINGS: Stable cardiomegaly. Status post coronary bypass graft. Endotracheal nasogastric tubes have been removed. Right internal jugular Swan-Ganz catheter is unchanged. Left-sided chest tube is noted without pneumothorax. Minimal bibasilar subsegmental atelectasis is noted. Bony thorax is unremarkable. IMPRESSION: Endotracheal and nasogastric tubes have been removed. Left-sided chest tube is noted without definite pneumothorax. Minimal bibasilar subsegmental atelectasis. Electronically Signed   By: Marijo Conception M.D.   On: 06/02/2021 09:33   DG Chest Port 1 View  Result Date: 06/01/2021 CLINICAL DATA:  Post CABG EXAM: PORTABLE CHEST 1 VIEW COMPARISON:  05/30/2021 FINDINGS: Status post interval median sternotomy and CABG. Support apparatus includes endotracheal tube, esophagogastric tube, right neck pulmonary vascular catheter, tip projecting over the pulmonary outflow tract, and left chest and mediastinal drainage tubes. No significant pneumothorax. No acute abnormality of the lungs. IMPRESSION: 1.  Status post interval median sternotomy and CABG. 2.  Support apparatus appropriately positioned. Electronically Signed   By: Delanna Ahmadi M.D.   On: 06/01/2021 14:08   VAS US DOPPLER PRE CABG  Result Date: 05/31/2021 PREOPERATIVE VASCULAR EVALUATION Patient Name:  TRAVEZ STANCIL  Date of Exam:   05/30/2021 Medical Rec #: [id number]       Accession #:    [id number] Date of Birth: [birth year]       Patient Gender: M Patient Age:   71 years Exam Location:  [facility] Procedure:      VAS US DOPPLER PRE CABG Referring Phys: Gilford Raid  --------------------------------------------------------------------------------  Indications:      Pre-CABG. Risk Factors:     Hypertension. Comparison Study: No prior studies. Performing Technologist: Carlos Levering RVT  Examination Guidelines: A complete evaluation includes B-mode imaging, spectral Doppler, color Doppler, and power Doppler as needed of all accessible portions of each vessel. Bilateral testing is considered an integral part of a complete examination. Limited examinations for reoccurring indications may be performed as noted.  Right Carotid Findings: +----------+--------+--------+--------+-----------------------+--------+           PSV cm/sEDV cm/sStenosisDescribe               Comments +----------+--------+--------+--------+-----------------------+--------+ CCA Prox  70      8               smooth and heterogenous         +----------+--------+--------+--------+-----------------------+--------+ CCA Distal64      12  smooth and heterogenous         +----------+--------+--------+--------+-----------------------+--------+ ICA Prox  50      12              smooth and heterogenous         +----------+--------+--------+--------+-----------------------+--------+ ICA Distal46      14                                     tortuous +----------+--------+--------+--------+-----------------------+--------+ ECA       82      9                                               +----------+--------+--------+--------+-----------------------+--------+ +----------+--------+-------+--------+------------+           PSV cm/sEDV cmsDescribeArm Pressure +----------+--------+-------+--------+------------+ Subclavian70                                  +----------+--------+-------+--------+------------+ +---------+--------+--+--------+--+---------+ VertebralPSV cm/s42EDV cm/s12Antegrade +---------+--------+--+--------+--+---------+ Left Carotid Findings:  +----------+--------+--------+--------+-----------------------+--------+           PSV cm/sEDV cm/sStenosisDescribe               Comments +----------+--------+--------+--------+-----------------------+--------+ CCA Prox  94      14              smooth and heterogenous         +----------+--------+--------+--------+-----------------------+--------+ CCA Distal92      20              smooth and heterogenous         +----------+--------+--------+--------+-----------------------+--------+ ICA Prox  45      15              smooth and heterogenous         +----------+--------+--------+--------+-----------------------+--------+ ICA Distal67      22                                     tortuous +----------+--------+--------+--------+-----------------------+--------+ ECA       64      7                                               +----------+--------+--------+--------+-----------------------+--------+ +----------+--------+--------+--------+------------+ SubclavianPSV cm/sEDV cm/sDescribeArm Pressure +----------+--------+--------+--------+------------+           108                                  +----------+--------+--------+--------+------------+ +---------+--------+--+--------+--+---------+ VertebralPSV cm/s37EDV cm/s10Antegrade +---------+--------+--+--------+--+---------+  ABI Findings: +--------+------------------+-----+---------+--------+ Right   Rt Pressure (mmHg)IndexWaveform Comment  +--------+------------------+-----+---------+--------+ [id number]                    triphasic         +--------+------------------+-----+---------+--------+ PTA     159               1.07 biphasic          +--------+------------------+-----+---------+--------+ DP      170  1.14 biphasic          +--------+------------------+-----+---------+--------+ +--------+------------------+-----+---------+-------+ Left    Lt Pressure  (mmHg)IndexWaveform Comment +--------+------------------+-----+---------+-------+ [id number]                    triphasic        +--------+------------------+-----+---------+-------+ PTA     174               1.17 triphasic        +--------+------------------+-----+---------+-------+ DP      171               1.15 biphasic         +--------+------------------+-----+---------+-------+ +-------+---------------+----------------+ ABI/TBIToday's ABI/TBIPrevious ABI/TBI +-------+---------------+----------------+ Right  1.14                            +-------+---------------+----------------+ Left   1.17                            +-------+---------------+----------------+  Right Doppler Findings: +--------+--------+-----+---------+--------+ Site    PressureIndexDoppler  Comments +--------+--------+-----+---------+--------+ [id number]          triphasic         +--------+--------+-----+---------+--------+ Radial               triphasic         +--------+--------+-----+---------+--------+ Ulnar                triphasic         +--------+--------+-----+---------+--------+  Left Doppler Findings: +--------+--------+-----+---------+--------+ Site    PressureIndexDoppler  Comments +--------+--------+-----+---------+--------+ [id number]          triphasic         +--------+--------+-----+---------+--------+ Radial               triphasic         +--------+--------+-----+---------+--------+ Ulnar                triphasic         +--------+--------+-----+---------+--------+  Summary: Right Carotid: Velocities in the right ICA are consistent with a 1-39% stenosis. Left Carotid: Velocities in the left ICA are consistent with a 1-39% stenosis. Vertebrals: Bilateral vertebral arteries demonstrate antegrade flow. Right ABI: Resting right ankle-brachial index is within normal range. No evidence of significant right lower extremity arterial disease.  Left ABI: Resting left ankle-brachial index is within normal range. No evidence of significant left lower extremity arterial disease. Right Upper Extremity: Doppler waveforms decrease >50% with right radial compression. Doppler waveforms decrease >50% with right ulnar compression. Left Upper Extremity: Doppler waveforms remain within normal limits with left radial compression. Doppler waveforms decrease >50% with left ulnar compression.  Electronically signed by Orlie Pollen on 05/31/2021 at 5:10:35 PM.    Final     Discharge Instructions     Amb Referral to Cardiac Rehabilitation   Complete by: As directed    Diagnosis: CABG   CABG X ___: 4   After initial evaluation and assessments completed: Virtual Based Care may be provided alone or in conjunction with Phase 2 Cardiac Rehab based on patient barriers.: Yes       Discharge Medications: Allergies as of 06/06/2021   No Known Allergies      Medication List     STOP taking these medications    amLODipine 5 MG tablet Commonly known as: NORVASC   isosorbide mononitrate 30 MG 24 hr tablet Commonly known as: IMDUR   losartan 100 MG  tablet Commonly known as: COZAAR   nitroGLYCERIN 0.4 MG SL tablet Commonly known as: NITROSTAT       TAKE these medications    aspirin EC 81 MG tablet Take 1 tablet (81 mg total) by mouth daily.   cholecalciferol 1000 units tablet Commonly known as: VITAMIN D Take 1,000 Units by mouth daily.   clopidogrel 75 MG tablet Commonly known as: PLAVIX Take 1 tablet (75 mg total) by mouth daily.   furosemide 40 MG tablet Commonly known as: LASIX Take 1 tablet (40 mg total) by mouth daily. For 5 days then stop.   levothyroxine 50 MCG tablet Commonly known as: SYNTHROID Take 50 mcg by mouth daily before breakfast.   nebivolol 10 MG tablet Commonly known as: BYSTOLIC Take 1 tablet (10 mg total) by mouth daily. What changed:  medication strength how much to take   pantoprazole 40 MG  tablet Commonly known as: PROTONIX Take 1 tablet (40 mg total) by mouth daily.   potassium chloride SA 20 MEQ tablet Commonly known as: KLOR-CON M Take 1 tablet (20 mEq total) by mouth daily. For 5 days then stop.   rosuvastatin 40 MG tablet Commonly known as: CRESTOR Take 1 tablet (40 mg total) by mouth daily.   tamsulosin 0.4 MG Caps capsule Commonly known as: FLOMAX Take 0.4 mg by mouth daily.   traMADol 50 MG tablet Commonly known as: ULTRAM Take 1 tablet (50 mg total) by mouth every 6 (six) hours as needed for moderate pain.       The patient has been discharged on:   1.Beta Blocker:  Yes [ x  ]                              No   [   ]                              If No, reason:  2.Ace Inhibitor/ARB: Yes [   ]                                     No  [  x  ]                                     If No, reason:Labile BP. Will try to start after discharge when BP allows  3.Statin:   Yes [ x  ]                  No  [   ]                  If No, reason:  4.Ecasa:  Yes  [ x  ]                  No   [   ]                  If No, reason:  Patient had ACS upon admission:No  Plavix/P2Y12 inhibitor: Yes [ x  ]. He has diffuse CAD  No  [   ]  Follow Up Appointments:  Follow-up Information     Nigel Mormon, MD. Go on 07/05/2021.   Specialties: Cardiology, Radiology Why: Appointment time is at 10:30 am Contact information: [place] [address] [phone]         Gaye Pollack, MD. Go on 07/04/2021.   Specialty: Cardiothoracic Surgery Why: PA/LAT CXR to be taken (at [facility] which is in the same building as Dr. Vivi Martens office) on 07/04/2021 at 4:00 pm;Appointment time is at 4:30 Contact information: [address] [place] [address] [phone]         [facility]. Go on 06/15/2021.   Specialty: Cardiothoracic  Surgery Why: Appointment is with nurse only to have chest tube sutures removed. Appointment is at 10:00 am Contact information: [address] [place], [place] [phone]                Signed: [place] 06/06/2021, 7:54 AM

## 2021-06-01 NOTE — Op Note (Signed)
CARDIOVASCULAR SURGERY OPERATIVE NOTE  06/01/2021  Surgeon:  Gaye Pollack, MD  First Assistant: Lars Pinks,  PA-C:   An experienced assistant was required given the complexity of this surgery and the standard of surgical care. The assistant was needed for exposure, dissection, suctioning, retraction of delicate tissues and sutures, instrument exchange and for overall help during this procedure.   Preoperative Diagnosis:  Severe multi-vessel coronary artery disease   Postoperative Diagnosis:  Same   Procedure:  Median Sternotomy Extracorporeal circulation 3.   Coronary artery bypass grafting x 4  Left internal mammary artery graft to the LAD SVG  to diagonal SVG to OM SVG to PDA 4.   Endoscopic vein harvest from the right leg   Anesthesia:  General Endotracheal   Clinical History/Surgical Indication:  This 70 year old gentleman has severe three-vessel coronary disease with normal left ventricular systolic function.  I agree that coronary artery bypass graft surgery is the best treatment for symptom relief and to prevent myocardial infarction and left ventricular dysfunction. I discussed the operative procedure with the patient and family including alternatives, benefits and risks; including but not limited to bleeding, blood transfusion, infection, stroke, myocardial infarction, graft failure, heart block requiring a permanent pacemaker, organ dysfunction, and death.  Cristian Cantrell understands and agrees to proceed.      Preparation:  The patient was seen in the preoperative holding area and the correct patient, correct operation were confirmed with the patient after reviewing the medical record and catheterization. The consent was signed by me. Preoperative antibiotics were given. A pulmonary arterial line and radial arterial line were placed by the anesthesia team. The  patient was taken back to the operating room and positioned supine on the operating room table. After being placed under general endotracheal anesthesia by the anesthesia team a foley catheter was placed. The neck, chest, abdomen, and both legs were prepped with betadine soap and solution and draped in the usual sterile manner. A surgical time-out was taken and the correct patient and operative procedure were confirmed with the nursing and anesthesia staff.   Cardiopulmonary Bypass:  A median sternotomy was performed. The pericardium was opened in the midline. Right ventricular function appeared normal. The ascending aorta was of normal size and had no palpable plaque. There were no contraindications to aortic cannulation or cross-clamping. The patient was fully systemically heparinized and the ACT was maintained > 400 sec. The proximal aortic arch was cannulated with a 20 F aortic cannula for arterial inflow. Venous cannulation was performed via the right atrial appendage using a two-staged venous cannula. An antegrade cardioplegia/vent cannula was inserted into the mid-ascending aorta. Aortic occlusion was performed with a single cross-clamp. Systemic cooling to 32 degrees Centigrade and topical cooling of the heart with iced saline were used. Cold antegrade KBC cardioplegia was used to induce diastolic arrest and was then given at about 60 minute intervals throughout the period of arrest to maintain myocardial temperature at or below 10 degrees centigrade. A temperature probe was inserted into the interventricular septum and an insulating pad was placed in the pericardium.   Left internal mammary artery harvest:  The left side of the sternum was retracted using the Rultract retractor. The left internal mammary artery was harvested as a pedicle graft. All side branches were clipped. It was a medium-sized vessel of good quality with excellent blood flow. It was ligated distally and divided. It was sprayed  with topical papaverine solution to prevent vasospasm.   Endoscopic vein harvest:  The right greater saphenous vein was harvested endoscopically through a 2 cm incision medial to the right knee. It was harvested from the upper thigh to below the knee. It was a medium-sized vein of fair quality. It was somewhat thickened. The side branches were all ligated with 4-0 silk ties.    Coronary arteries:  The coronary arteries were examined.  LAD:  medium caliber vessel with segmental diffuse disease. The first diagonal was small but graftable. The second diagonal was too small to graft. LCX:  Large OM with segmental dital disease. RCA:  Diffusely diseased. The PDA was soft enough to open in its mid-portion but there was still some plaque in the distal vessel beyond that.   Grafts:  LIMA to the LAD: 1.75 mm. It was sewn end to side using 8-0 prolene continuous suture. SVG to diagonal:  1.5 mm. It was sewn end to side using 7-0 prolene continuous suture. SVG to OM:  1.75 mm. It was sewn end to side using 7-0 prolene continuous suture. SVG to PDA:  1.6 mm. It was sewn end to side using 7-0 prolene continuous suture.  The proximal vein graft anastomoses of the OM and PDA vein graft were performed to the mid-ascending aorta using continuous 6-0 prolene suture. The proximal anastomosis of the diagonal vein graft was performed to the proximal OM graft in end to side manner using continuous 7-0 Prolene suture. Graft markers were placed around the proximal anastomoses.   Completion:  The patient was rewarmed to 37 degrees Centigrade. The clamp was removed from the LIMA pedicle and there was rapid warming of the septum and return of ventricular fibrillation. The crossclamp was removed with a time of 94 minutes. There was spontaneous return of sinus rhythm. The distal and proximal anastomoses were checked for hemostasis. The position of the grafts was satisfactory. Two temporary epicardial pacing wires  were placed on the right atrium and two on the right ventricle. The patient was weaned from CPB without difficulty on no inotropes. CPB time was 110 minutes. Cardiac output was 5 LPM. TEE showed normal LV systolic function. Heparin was fully reversed with protamine and the aortic and venous cannulas removed. Hemostasis was achieved. Mediastinal and left pleural drainage tubes were placed. The sternum was closed with  #6 stainless steel wires. The fascia was closed with continuous # 1 vicryl suture. The subcutaneous tissue was closed with 2-0 vicryl continuous suture. The skin was closed with 3-0 vicryl subcuticular suture. All sponge, needle, and instrument counts were reported correct at the end of the case. Dry sterile dressings were placed over the incisions and around the chest tubes which were connected to pleurevac suction. The patient was then transported to the surgical intensive care unit in stable condition.

## 2021-06-02 ENCOUNTER — Inpatient Hospital Stay (HOSPITAL_COMMUNITY): Payer: Medicare Other

## 2021-06-02 LAB — MAGNESIUM
Magnesium: 2.4 mg/dL (ref 1.7–2.4)
Magnesium: 2.3 mg/dL (ref 1.7–2.4)

## 2021-06-02 LAB — CBC
HCT: 33.5 % — ABNORMAL LOW (ref 39.0–52.0)
HCT: 34.2 % — ABNORMAL LOW (ref 39.0–52.0)
Hemoglobin: 11.4 g/dL — ABNORMAL LOW (ref 13.0–17.0)
Hemoglobin: 11.5 g/dL — ABNORMAL LOW (ref 13.0–17.0)
MCH: 31.6 pg (ref 26.0–34.0)
MCH: 31.5 pg (ref 26.0–34.0)
MCHC: 34 g/dL (ref 30.0–36.0)
MCHC: 33.6 g/dL (ref 30.0–36.0)
MCV: 92.8 fL (ref 80.0–100.0)
MCV: 93.7 fL (ref 80.0–100.0)
Platelets: 98 10*3/uL — ABNORMAL LOW (ref 150–400)
Platelets: 107 10*3/uL — ABNORMAL LOW (ref 150–400)
RBC: 3.61 MIL/uL — ABNORMAL LOW (ref 4.22–5.81)
RBC: 3.65 MIL/uL — ABNORMAL LOW (ref 4.22–5.81)
RDW: 12.7 % (ref 11.5–15.5)
RDW: 13 % (ref 11.5–15.5)
WBC: 10.1 10*3/uL (ref 4.0–10.5)
WBC: 11.7 10*3/uL — ABNORMAL HIGH (ref 4.0–10.5)
nRBC: 0 % (ref 0.0–0.2)
nRBC: 0 % (ref 0.0–0.2)

## 2021-06-02 LAB — BASIC METABOLIC PANEL
Anion gap: 7 (ref 5–15)
Anion gap: 6 (ref 5–15)
BUN: 11 mg/dL (ref 8–23)
BUN: 13 mg/dL (ref 8–23)
CO2: 24 mmol/L (ref 22–32)
CO2: 26 mmol/L (ref 22–32)
Calcium: 7.8 mg/dL — ABNORMAL LOW (ref 8.9–10.3)
Calcium: 8.3 mg/dL — ABNORMAL LOW (ref 8.9–10.3)
Chloride: 105 mmol/L (ref 98–111)
Chloride: 102 mmol/L (ref 98–111)
Creatinine, Ser: 1.31 mg/dL — ABNORMAL HIGH (ref 0.61–1.24)
Creatinine, Ser: 1.52 mg/dL — ABNORMAL HIGH (ref 0.61–1.24)
GFR, Estimated: 58 mL/min — ABNORMAL LOW (ref >60)
GFR, Estimated: 48 mL/min — ABNORMAL LOW (ref >60)
Glucose, Bld: 134 mg/dL — ABNORMAL HIGH (ref 70–99)
Glucose, Bld: 127 mg/dL — ABNORMAL HIGH (ref 70–99)
Potassium: 4.4 mmol/L (ref 3.5–5.1)
Potassium: 4.2 mmol/L (ref 3.5–5.1)
Sodium: 136 mmol/L (ref 135–145)
Sodium: 134 mmol/L — ABNORMAL LOW (ref 135–145)

## 2021-06-02 LAB — GLUCOSE, CAPILLARY
Glucose-Capillary: 108 mg/dL — ABNORMAL HIGH (ref 70–99)
Glucose-Capillary: 125 mg/dL — ABNORMAL HIGH (ref 70–99)
Glucose-Capillary: 132 mg/dL — ABNORMAL HIGH (ref 70–99)
Glucose-Capillary: 134 mg/dL — ABNORMAL HIGH (ref 70–99)
Glucose-Capillary: 139 mg/dL — ABNORMAL HIGH (ref 70–99)
Glucose-Capillary: 156 mg/dL — ABNORMAL HIGH (ref 70–99)
Glucose-Capillary: 158 mg/dL — ABNORMAL HIGH (ref 70–99)

## 2021-06-02 LAB — PROTIME-INR
INR: 1.6 — ABNORMAL HIGH (ref 0.8–1.2)
Prothrombin Time: 18.6 seconds — ABNORMAL HIGH (ref 11.4–15.2)

## 2021-06-02 MED ORDER — METOCLOPRAMIDE HCL 5 MG/ML IJ SOLN
10.0000 mg | Freq: Four times a day (QID) | INTRAMUSCULAR | Status: DC
  Administered 2021-06-02 – 2021-06-03 (×3): 10 mg via INTRAVENOUS
  Filled 2021-06-02 (×3): qty 2

## 2021-06-02 MED ORDER — ENOXAPARIN SODIUM 40 MG/0.4ML IJ SOSY
40.0000 mg | PREFILLED_SYRINGE | Freq: Every day | INTRAMUSCULAR | Status: DC
  Administered 2021-06-02: 40 mg via SUBCUTANEOUS
  Filled 2021-06-02: qty 0.4

## 2021-06-02 MED ORDER — ROSUVASTATIN CALCIUM 20 MG PO TABS
40.0000 mg | ORAL_TABLET | Freq: Every day | ORAL | Status: DC
  Administered 2021-06-03 – 2021-06-05 (×3): 40 mg via ORAL
  Filled 2021-06-02 (×3): qty 2

## 2021-06-02 NOTE — Plan of Care (Signed)

## 2021-06-02 NOTE — Progress Notes (Signed)
      [address]       [place] [address]             [phone]      POD # 1 CABG  Sleeping  BP 126/74   Pulse 79   Temp 98.6 F (37 C) (Oral)   Resp 13   Ht 6' (1.829 m)   Wt 87.3 kg   SpO2 95%   BMI 26.10 kg/m   Intake/Output Summary (Last 24 hours) at 06/02/2021 1822 Last data filed at 06/02/2021 1800 Gross per 24 hour  Intake 1032.24 ml  Output 1785 ml  Net -752.76 ml   Creatinine up slightly to 1.52, K= 4.2 Hct 34 CBG mildly elevated  Remo Lipps C. Roxan Hockey, MD [facility] [phone]

## 2021-06-02 NOTE — Progress Notes (Signed)
1 Day Post-Op Procedure(s) (LRB): CORONARY ARTERY BYPASS GRAFTING (CABG) X 4   USING LEFT INTERNAL MAMMARY ARTERY AND RIGHT ENDOSCOPIC GREATER SAPHENOUS VEIN CONDUITS (N/A) TRANSESOPHAGEAL ECHOCARDIOGRAM (TEE) (N/A) APPLICATION OF CELL SAVER ENDOVEIN HARVEST OF GREATER SAPHENOUS VEIN (Right) Subjective: Some nausea earlier, minimal pain  Objective: Vital signs in last 24 hours: Temp:  [96.3 F (35.7 C)-100.2 F (37.9 C)] 99.7 F (37.6 C) (12/10 0700) Pulse Rate:  [77-96] 83 (12/10 0700) Cardiac Rhythm: Normal sinus rhythm (12/09 2000) Resp:  [11-27] 17 (12/10 0700) BP: (82-140)/(68-92) 137/79 (12/10 0600) SpO2:  [95 %-100 %] 96 % (12/10 0700) Arterial Line BP: (88-163)/(59-89) 116/63 (12/10 0700) FiO2 (%):  [40 %-50 %] 40 % (12/09 1703) Weight:  [87.3 kg] 87.3 kg (12/10 0500)  Hemodynamic parameters for last 24 hours: PAP: (16-39)/(6-29) 16/6 CVP:  [1 mmHg-18 mmHg] 1 mmHg CO:  [2.7 L/min-5 L/min] 4.9 L/min CI:  [1.3 L/min/m2-2.5 L/min/m2] 2.4 L/min/m2  Intake/Output from previous day: 12/09 0701 - 12/10 0700 In: 4099.5 [I.V.:2350.4; Blood:555; IV Piggyback:1194.1] Out: 3762 [Urine:3385; Emesis/NG output:150; Blood:854; Chest Tube:488] Intake/Output this shift: No intake/output data recorded.  General appearance: alert, cooperative, and no distress Neurologic: intact Heart: regular rate and rhythm Lungs: diminished breath sounds bibasilar Abdomen: mildly distended, tympanitic, nontender  Lab Results: Recent Labs    06/01/21 1421 06/01/21 1748 06/01/21 1857 06/02/21 0330  WBC 7.8  --   --  10.1  HGB 10.9*   < > 10.9* 11.4*  HCT 31.4*   < > 32.0* 33.5*  PLT 84*  --   --  98*   < > = values in this interval not displayed.   BMET:  Recent Labs    06/01/21 2245 06/02/21 0330  NA 136 136  K 4.3 4.4  CL 105 105  CO2 22 24  GLUCOSE 169* 134*  BUN 11 11  CREATININE 1.29* 1.31*  CALCIUM 7.6* 7.8*    PT/INR:  Recent Labs    06/02/21 0330  LABPROT 18.6*   INR 1.6*   ABG    Component Value Date/Time   PHART 7.348 (L) 06/01/2021 1857   HCO3 20.1 06/01/2021 1857   TCO2 21 (L) 06/01/2021 1857   ACIDBASEDEF 5.0 (H) 06/01/2021 1857   O2SAT 96.0 06/01/2021 1857   CBG (last 3)  Recent Labs    06/02/21 0029 06/02/21 0355 06/02/21 0824  GLUCAP 156* 134* 139*    Assessment/Plan: S/P Procedure(s) (LRB): CORONARY ARTERY BYPASS GRAFTING (CABG) X 4   USING LEFT INTERNAL MAMMARY ARTERY AND RIGHT ENDOSCOPIC GREATER SAPHENOUS VEIN CONDUITS (N/A) TRANSESOPHAGEAL ECHOCARDIOGRAM (TEE) (N/A) APPLICATION OF CELL SAVER ENDOVEIN HARVEST OF GREATER SAPHENOUS VEIN (Right) -POD # 1 Overall doing well NEURO- intact CV- in SR with good CO  Dc Swan and A line  ASA, beta blocker, statin RESP- IS for atelectasis RENAL- creatinine at baseline, monitor ENDO- CBG well controlled Gi- some gastric dilatation on CXR  Will give Reglan, caution with POs Anemia secondary to ABL- mild, follow Thrombocytopenia- improved, no signs of bleeding Dc chest tubes SCD + enoxaparin for DVT prophylaxis Cardiac rehab     LOS: 1 day    Melrose Nakayama 06/02/2021

## 2021-06-02 NOTE — Significant Event (Signed)
Introducer on right IJ discontinued as there was air in line and unable to remove it. No issues post removal.

## 2021-06-03 ENCOUNTER — Inpatient Hospital Stay (HOSPITAL_COMMUNITY): Payer: Medicare Other

## 2021-06-03 LAB — CBC
HCT: 31.7 % — ABNORMAL LOW (ref 39.0–52.0)
Hemoglobin: 10.5 g/dL — ABNORMAL LOW (ref 13.0–17.0)
MCH: 31.1 pg (ref 26.0–34.0)
MCHC: 33.1 g/dL (ref 30.0–36.0)
MCV: 93.8 fL (ref 80.0–100.0)
Platelets: 90 10*3/uL — ABNORMAL LOW (ref 150–400)
RBC: 3.38 MIL/uL — ABNORMAL LOW (ref 4.22–5.81)
RDW: 13 % (ref 11.5–15.5)
WBC: 10.9 10*3/uL — ABNORMAL HIGH (ref 4.0–10.5)
nRBC: 0 % (ref 0.0–0.2)

## 2021-06-03 LAB — BASIC METABOLIC PANEL
Anion gap: 6 (ref 5–15)
BUN: 13 mg/dL (ref 8–23)
CO2: 25 mmol/L (ref 22–32)
Calcium: 7.9 mg/dL — ABNORMAL LOW (ref 8.9–10.3)
Chloride: 104 mmol/L (ref 98–111)
Creatinine, Ser: 1.31 mg/dL — ABNORMAL HIGH (ref 0.61–1.24)
GFR, Estimated: 58 mL/min — ABNORMAL LOW (ref >60)
Glucose, Bld: 108 mg/dL — ABNORMAL HIGH (ref 70–99)
Potassium: 3.9 mmol/L (ref 3.5–5.1)
Sodium: 135 mmol/L (ref 135–145)

## 2021-06-03 LAB — GLUCOSE, CAPILLARY
Glucose-Capillary: 101 mg/dL — ABNORMAL HIGH (ref 70–99)
Glucose-Capillary: 155 mg/dL — ABNORMAL HIGH (ref 70–99)
Glucose-Capillary: 129 mg/dL — ABNORMAL HIGH (ref 70–99)
Glucose-Capillary: 143 mg/dL — ABNORMAL HIGH (ref 70–99)
Glucose-Capillary: 134 mg/dL — ABNORMAL HIGH (ref 70–99)
Glucose-Capillary: 129 mg/dL — ABNORMAL HIGH (ref 70–99)

## 2021-06-03 MED ORDER — ZOLPIDEM TARTRATE 5 MG PO TABS: 5.0000 mg | ORAL_TABLET | Freq: Every evening | ORAL | Status: DC | PRN

## 2021-06-03 MED ORDER — FUROSEMIDE 40 MG PO TABS
40.0000 mg | ORAL_TABLET | Freq: Every day | ORAL | Status: DC
  Administered 2021-06-03 – 2021-06-06 (×4): 40 mg via ORAL
  Filled 2021-06-03 (×4): qty 1

## 2021-06-03 MED ORDER — PANTOPRAZOLE SODIUM 40 MG PO TBEC
40.0000 mg | DELAYED_RELEASE_TABLET | Freq: Every day | ORAL | Status: DC
  Administered 2021-06-03 – 2021-06-06 (×4): 40 mg via ORAL
  Filled 2021-06-03 (×4): qty 1

## 2021-06-03 MED ORDER — ALUM & MAG HYDROXIDE-SIMETH 200-200-20 MG/5ML PO SUSP: 15.0000 mL | Freq: Four times a day (QID) | ORAL | Status: DC | PRN

## 2021-06-03 MED ORDER — SODIUM CHLORIDE 0.9% FLUSH: 3.0000 mL | INTRAVENOUS | Status: DC | PRN

## 2021-06-03 MED ORDER — SODIUM CHLORIDE 0.9 % IV SOLN: 250.0000 mL | INTRAVENOUS | Status: DC | PRN

## 2021-06-03 MED ORDER — ~~LOC~~ CARDIAC SURGERY, PATIENT & FAMILY EDUCATION: Freq: Once | Status: AC

## 2021-06-03 MED ORDER — POTASSIUM CHLORIDE ER 10 MEQ PO TBCR
20.0000 meq | EXTENDED_RELEASE_TABLET | Freq: Every day | ORAL | Status: DC
  Administered 2021-06-03: 20 meq via ORAL
  Filled 2021-06-03 (×2): qty 2

## 2021-06-03 MED ORDER — MAGNESIUM HYDROXIDE 400 MG/5ML PO SUSP: 30.0000 mL | Freq: Every day | ORAL | Status: DC | PRN

## 2021-06-03 MED ORDER — NEBIVOLOL HCL 5 MG PO TABS
5.0000 mg | ORAL_TABLET | Freq: Every day | ORAL | Status: DC
  Administered 2021-06-03 – 2021-06-04 (×2): 5 mg via ORAL
  Filled 2021-06-03 (×3): qty 1

## 2021-06-03 MED ORDER — SODIUM CHLORIDE 0.9% FLUSH
3.0000 mL | Freq: Two times a day (BID) | INTRAVENOUS | Status: DC
  Administered 2021-06-03 – 2021-06-06 (×6): 3 mL via INTRAVENOUS

## 2021-06-03 MED ORDER — INSULIN ASPART 100 UNIT/ML IJ SOLN
0.0000 [IU] | Freq: Three times a day (TID) | INTRAMUSCULAR | Status: DC
  Administered 2021-06-03 (×2): 2 [IU] via SUBCUTANEOUS

## 2021-06-03 NOTE — Progress Notes (Signed)
2 Days Post-Op Procedure(s) (LRB): CORONARY ARTERY BYPASS GRAFTING (CABG) X 4   USING LEFT INTERNAL MAMMARY ARTERY AND RIGHT ENDOSCOPIC GREATER SAPHENOUS VEIN CONDUITS (N/A) TRANSESOPHAGEAL ECHOCARDIOGRAM (TEE) (N/A) APPLICATION OF CELL SAVER ENDOVEIN HARVEST OF GREATER SAPHENOUS VEIN (Right) Subjective: No complaints this AM  Objective: Vital signs in last 24 hours: Temp:  [97.7 F (36.5 C)-100.2 F (37.9 C)] 98.7 F (37.1 C) (12/11 0723) Pulse Rate:  [66-102] 102 (12/11 0500) Cardiac Rhythm: Normal sinus rhythm (12/10 2000) Resp:  [10-25] 16 (12/11 0800) BP: (98-160)/(61-113) 98/61 (12/11 0800) SpO2:  [91 %-98 %] 91 % (12/11 0500) Arterial Line BP: (132)/(77) 132/77 (12/10 0900) Weight:  [87.2 kg] 87.2 kg (12/11 0500)  Hemodynamic parameters for last 24 hours: PAP: (27)/(15) 27/15  Intake/Output from previous day: 12/10 0701 - 12/11 0700 In: 682.4 [P.O.:480; IV Piggyback:202.4] Out: 1220 [Urine:1120; Chest Tube:100] Intake/Output this shift: Total I/O In: -  Out: 175 [Urine:175]  General appearance: alert, cooperative, and no distress Neurologic: intact Heart: regular rate and rhythm Lungs: diminished breath sounds bibasilar Abdomen: normal findings: soft, non-tender  Lab Results: Recent Labs    06/02/21 1607 06/03/21 0112  WBC 11.7* 10.9*  HGB 11.5* 10.5*  HCT 34.2* 31.7*  PLT 107* 90*   BMET:  Recent Labs    06/02/21 1607 06/03/21 0112  NA 134* 135  K 4.2 3.9  CL 102 104  CO2 26 25  GLUCOSE 127* 108*  BUN 13 13  CREATININE 1.52* 1.31*  CALCIUM 8.3* 7.9*    PT/INR:  Recent Labs    06/02/21 0330  LABPROT 18.6*  INR 1.6*   ABG    Component Value Date/Time   PHART 7.348 (L) 06/01/2021 1857   HCO3 20.1 06/01/2021 1857   TCO2 21 (L) 06/01/2021 1857   ACIDBASEDEF 5.0 (H) 06/01/2021 1857   O2SAT 96.0 06/01/2021 1857   CBG (last 3)  Recent Labs    06/02/21 2335 06/03/21 0426 06/03/21 0721  GLUCAP 108* 101* 155*     Assessment/Plan: S/P Procedure(s) (LRB): CORONARY ARTERY BYPASS GRAFTING (CABG) X 4   USING LEFT INTERNAL MAMMARY ARTERY AND RIGHT ENDOSCOPIC GREATER SAPHENOUS VEIN CONDUITS (N/A) TRANSESOPHAGEAL ECHOCARDIOGRAM (TEE) (N/A) APPLICATION OF CELL SAVER ENDOVEIN HARVEST OF GREATER SAPHENOUS VEIN (Right) POD # 2, looks great NEURO- intact CV- in SR. Was on Bystolic at home- resume and dc metoprolol  ASA, statin RESP- continue IS RENAL- creatinine mildly elevated but improved  Diurese as still 10 pounds above preop weight ENDO- CBG well controlled  Change to [facility] and HS GI- tolerating diet SCD + ambulation for DVT prophylaxis, PLT count down from 107 to 90K so will stop enoxaparin Cardiac rehab Transfer to 4E   LOS: 2 days    Melrose Nakayama 06/03/2021

## 2021-06-03 NOTE — Progress Notes (Signed)
Patient to room 4E09 from [facility]. Vital signs obtained. On monitor CCMD notified. CHG bath completed. Patient alert and oriented to room and call light. Call bell within reach.  Era Bumpers, RN

## 2021-06-04 ENCOUNTER — Encounter (HOSPITAL_COMMUNITY): Payer: Self-pay | Admitting: Surgery

## 2021-06-04 ENCOUNTER — Inpatient Hospital Stay (HOSPITAL_COMMUNITY): Payer: Medicare Other

## 2021-06-04 LAB — CBC
HCT: 31.5 % — ABNORMAL LOW (ref 39.0–52.0)
Hemoglobin: 10.7 g/dL — ABNORMAL LOW (ref 13.0–17.0)
MCH: 31.7 pg (ref 26.0–34.0)
MCHC: 34 g/dL (ref 30.0–36.0)
MCV: 93.2 fL (ref 80.0–100.0)
Platelets: 92 10*3/uL — ABNORMAL LOW (ref 150–400)
RBC: 3.38 MIL/uL — ABNORMAL LOW (ref 4.22–5.81)
RDW: 12.7 % (ref 11.5–15.5)
WBC: 8.2 10*3/uL (ref 4.0–10.5)
nRBC: 0 % (ref 0.0–0.2)

## 2021-06-04 LAB — BASIC METABOLIC PANEL
Anion gap: 7 (ref 5–15)
BUN: 15 mg/dL (ref 8–23)
CO2: 27 mmol/L (ref 22–32)
Calcium: 8.2 mg/dL — ABNORMAL LOW (ref 8.9–10.3)
Chloride: 104 mmol/L (ref 98–111)
Creatinine, Ser: 1.37 mg/dL — ABNORMAL HIGH (ref 0.61–1.24)
GFR, Estimated: 55 mL/min — ABNORMAL LOW (ref >60)
Glucose, Bld: 108 mg/dL — ABNORMAL HIGH (ref 70–99)
Potassium: 3.7 mmol/L (ref 3.5–5.1)
Sodium: 138 mmol/L (ref 135–145)

## 2021-06-04 LAB — GLUCOSE, CAPILLARY
Glucose-Capillary: 113 mg/dL — ABNORMAL HIGH (ref 70–99)
Glucose-Capillary: 145 mg/dL — ABNORMAL HIGH (ref 70–99)

## 2021-06-04 MED ORDER — POTASSIUM CHLORIDE CRYS ER 20 MEQ PO TBCR
20.0000 meq | EXTENDED_RELEASE_TABLET | Freq: Every day | ORAL | Status: DC
  Administered 2021-06-05 – 2021-06-06 (×2): 20 meq via ORAL
  Filled 2021-06-04 (×2): qty 1

## 2021-06-04 MED ORDER — POTASSIUM CHLORIDE CRYS ER 10 MEQ PO TBCR
30.0000 meq | EXTENDED_RELEASE_TABLET | Freq: Two times a day (BID) | ORAL | Status: AC
  Administered 2021-06-04 (×2): 30 meq via ORAL
  Filled 2021-06-04 (×2): qty 3

## 2021-06-04 NOTE — Progress Notes (Addendum)
[address]       [place] [address]             [phone]        3 Days Post-Op Procedure(s) (LRB): CORONARY ARTERY BYPASS GRAFTING (CABG) X 4   USING LEFT INTERNAL MAMMARY ARTERY AND RIGHT ENDOSCOPIC GREATER SAPHENOUS VEIN CONDUITS (N/A) TRANSESOPHAGEAL ECHOCARDIOGRAM (TEE) (N/A) APPLICATION OF CELL SAVER ENDOVEIN HARVEST OF GREATER SAPHENOUS VEIN (Right)  Subjective: Patient just finished eating breakfast. He has had a tiny bowel movement  Objective: Vital signs in last 24 hours: Temp:  [98.1 F (36.7 C)-99.4 F (37.4 C)] 98.1 F (36.7 C) (12/12 0311) Pulse Rate:  [73-95] 73 (12/12 0311) Cardiac Rhythm: Normal sinus rhythm (12/11 1900) Resp:  [14-20] 16 (12/12 0311) BP: (98-148)/(61-98) 123/76 (12/12 0311) SpO2:  [89 %-96 %] 96 % (12/12 0311) Weight:  [85.2 kg] 85.2 kg (12/12 0500)  Pre op weight  81.6 kg Current Weight  06/04/21 85.2 kg       Intake/Output from previous day: 12/11 0701 - 12/12 0700 In: 240 [P.O.:240] Out: 0086 [Urine:1375]   Physical Exam:  Cardiovascular: RRR Pulmonary: Clear to auscultation bilaterally; Abdomen: Soft, non tender, bowel sounds present. Extremities: Mild bilateral lower extremity edema. Wounds: Sternal dressing removed and wound is clean and dry.  No erythema or signs of infection. RLE wounds are clean, dry.  Lab Results: CBC: Recent Labs    06/03/21 0112 06/04/21 0221  WBC 10.9* 8.2  HGB 10.5* 10.7*  HCT 31.7* 31.5*  PLT 90* 92*   BMET:  Recent Labs    06/03/21 0112 06/04/21 0221  NA 135 138  K 3.9 3.7  CL 104 104  CO2 25 27  GLUCOSE 108* 108*  BUN 13 15  CREATININE 1.31* 1.37*  CALCIUM 7.9* 8.2*    PT/INR:  Lab Results  Component Value Date   INR 1.6 (H) 06/02/2021   INR 1.7 (H) 06/01/2021   INR 1.2 05/30/2021   ABG:  INR: Will add last result for INR, ABG once components are confirmed Will add last 4 CBG results once components are  confirmed  Assessment/Plan:  1. CV - SR, PVCs with HR in the 90's this am. Pacer on back up at 40. On Bystolic 5 mg daily. Will disconnect pacer and remove EPW. 2.  Pulmonary - On 2 liters via [place]. Wean as able. CXR this am appears stable. Encourage incentive spirometer. 3. Volume Overload - On Lasix 40 mg daily 4.  Expected post op acute blood loss anemia - H and H this am stable at 10.7 and 31.5 5.CBGs 134/126/113. Pre op HGA1C 5.6. Stop accu checks and SS PRN. 6. Supplement potassium 7. CKD (stage IIIA)-Creatinine slightly increased from 1.31 to 1.37. Creatinine prior to surgery 1.54. Chronic Kidney Disease   Stage I     GFR >90  Stage II    GFR 60-89  Stage IIIA GFR 45-59  Stage IIIB GFR 30-44  Stage IV   GFR 15-29  Stage V    GFR  <15  Lab Results  Component Value Date   CREATININE 1.37 (H) 06/04/2021   Estimated Creatinine Clearance: 53.5 mL/min (A) (by C-G formula based on SCr of 1.37 mg/dL (H)).  8. Thrombocytopenia-platelets this am slightly increased to 92,000 9. History of hypothyroidism-continue Levothyroxine 50 mcg daily  Donielle M ZimmermanPA-C 06/04/2021,6:57 AM     Chart reviewed, patient examined, agree with above. Wt is decreasing but still 8 lbs over preop.  Continue Lasix and K+ Plan to send home on ASA and Plavix due to diffuse CAD.

## 2021-06-04 NOTE — Progress Notes (Signed)
  Transition of Care [facility]) Screening Note   Patient Details  Name: [name] Date of Birth: 1951/05/14   Transition of Care [facility]) CM/SW Contact:    Dawayne Patricia, RN Phone Number: 06/04/2021, 12:19 PM    Transition of Care Department [facility]) has reviewed patient and no TOC needs have been identified at this time. We will continue to monitor patient advancement through interdisciplinary progression rounds. If new patient transition needs arise, please place a TOC consult.

## 2021-06-04 NOTE — Progress Notes (Signed)
Mobility Specialist: Progress Note   06/04/21 1501  Mobility  Activity Ambulated in hall  Level of Assistance Contact guard assist, steadying assist  Assistive Device None  Distance Ambulated (ft) 400 ft  Mobility Ambulated with assistance in hallway  Mobility Response Tolerated well  Mobility performed by Mobility specialist  $Mobility charge 1 Mobility   Post-Mobility: 97 HR, 131/80 BP, 95% SpO2  Pt slightly unsteady after standing but progressed better with distance. Pt c/o R shoulder and chest pain during ambulation, no rating given. Pt sitting EOB after walk with family member present in the room.   [facility] Jase Himmelberger Mobility Specialist Mobility Specialist Phone #1: [phone] Mobility Specialist Phone #2: [phone]

## 2021-06-04 NOTE — Progress Notes (Signed)
CARDIAC REHAB PHASE I   PRE:  Rate/Rhythm: 83 SR  BP:  Supine:   Sitting: 143/90  Standing:    SaO2: 95 2L  MODE:  Ambulation: 330 ft   POST:  Rate/Rhythm: 90 SR  BP:  Supine:   Sitting: 128/87  Standing:    SaO2: 93-95% RA Tolerated ambulation well independently.  Oxygen was left off, reported to nurse d/t spo2 93-95% ambulating.  Looks great, using IS appropriately.   1000-1030  Liliane Channel RN, BSN 06/04/2021 10:26 AM

## 2021-06-04 NOTE — Care Management Important Message (Signed)
Important Message  Patient Details  Name: CARLETON VANVALKENBURGH MRN: [id number] Date of Birth: 1950/11/16   Medicare Important Message Given:  Yes     Shelda Altes 06/04/2021, 10:06 AM

## 2021-06-05 LAB — BASIC METABOLIC PANEL
Anion gap: 7 (ref 5–15)
BUN: 15 mg/dL (ref 8–23)
CO2: 27 mmol/L (ref 22–32)
Calcium: 8.3 mg/dL — ABNORMAL LOW (ref 8.9–10.3)
Chloride: 102 mmol/L (ref 98–111)
Creatinine, Ser: 1.5 mg/dL — ABNORMAL HIGH (ref 0.61–1.24)
GFR, Estimated: 49 mL/min — ABNORMAL LOW (ref >60)
Glucose, Bld: 116 mg/dL — ABNORMAL HIGH (ref 70–99)
Potassium: 4.1 mmol/L (ref 3.5–5.1)
Sodium: 136 mmol/L (ref 135–145)

## 2021-06-05 MED ORDER — ASPIRIN EC 81 MG PO TBEC
81.0000 mg | DELAYED_RELEASE_TABLET | Freq: Every day | ORAL | Status: DC
  Administered 2021-06-05 – 2021-06-06 (×2): 81 mg via ORAL
  Filled 2021-06-05 (×2): qty 1

## 2021-06-05 MED ORDER — NEBIVOLOL HCL 10 MG PO TABS
10.0000 mg | ORAL_TABLET | Freq: Every day | ORAL | Status: DC
  Administered 2021-06-05 – 2021-06-06 (×2): 10 mg via ORAL
  Filled 2021-06-05 (×2): qty 1

## 2021-06-05 MED ORDER — CLOPIDOGREL BISULFATE 75 MG PO TABS
75.0000 mg | ORAL_TABLET | Freq: Every day | ORAL | Status: DC
  Administered 2021-06-05 – 2021-06-06 (×2): 75 mg via ORAL
  Filled 2021-06-05 (×2): qty 1

## 2021-06-05 NOTE — Progress Notes (Signed)
Mobility Specialist: Progress Note   06/05/21 1802  Mobility  Activity Ambulated in hall  Level of Assistance Independent  Assistive Device None  Distance Ambulated (ft) 1000 ft  Mobility Ambulated independently in hallway  Mobility Response Tolerated well  Mobility performed by Mobility specialist  $Mobility charge 1 Mobility   Post-Mobility: 98 HR  Pt had no c/o throughout ambulation. Pt back to bed after walk with call bell in reach and family member present in the room.   [facility] Jameyah Fennewald Mobility Specialist Mobility Specialist [address] [place]: [phone] Mobility Specialist 2 [place]: [phone]

## 2021-06-05 NOTE — Progress Notes (Signed)
CARDIAC REHAB PHASE I   Offered to walk with pt. Pt states independent ambulation with wife for 2 laps this am. Pt denies CP, SOB, or dizziness. Pt denies DME needs at this time. Hopeful for d/c tomorrow. Encouraged continued IS use and ambulation. Will continue to follow.  [id number] Rufina Falco, RN BSN 06/05/2021 10:27 AM

## 2021-06-05 NOTE — Progress Notes (Addendum)
[address]       [place] [address]             [phone]        4 Days Post-Op Procedure(s) (LRB): CORONARY ARTERY BYPASS GRAFTING (CABG) X 4   USING LEFT INTERNAL MAMMARY ARTERY AND RIGHT ENDOSCOPIC GREATER SAPHENOUS VEIN CONDUITS (N/A) TRANSESOPHAGEAL ECHOCARDIOGRAM (TEE) (N/A) APPLICATION OF CELL SAVER ENDOVEIN HARVEST OF GREATER SAPHENOUS VEIN (Right)  Subjective: Cristian Cantrell eating breakfast this am. He felt some palpitations yesterday. He has incisional, sternal pain.  Objective: Vital signs in last 24 hours: Temp:  [97.7 F (36.5 C)-99.5 F (37.5 C)] 97.7 F (36.5 C) (12/13 0348) Pulse Rate:  [79-90] 85 (12/13 0348) Cardiac Rhythm: Normal sinus rhythm (12/12 2008) Resp:  [16-24] 16 (12/13 0348) BP: (113-132)/(72-85) 121/76 (12/13 0348) SpO2:  [90 %-96 %] 94 % (12/13 0348) Weight:  [88.3 kg] 88.3 kg (12/13 0348)  Pre op weight  81.6 kg Current Weight  06/05/21 88.3 kg       Intake/Output from previous day: No intake/output data recorded.   Physical Exam:  Cardiovascular: RRR Pulmonary: Clear to auscultation bilaterally; Abdomen: Soft, non tender, bowel sounds present. Extremities: Mild bilateral lower extremity edema. Wounds: Sternal dressing removed and wound is clean and dry.  No erythema or signs of infection. RLE wounds are clean, dry.  Lab Results: CBC: Recent Labs    06/03/21 0112 06/04/21 0221  WBC 10.9* 8.2  HGB 10.5* 10.7*  HCT 31.7* 31.5*  PLT 90* 92*    BMET:  Recent Labs    06/04/21 0221 06/05/21 0204  NA 138 136  K 3.7 4.1  CL 104 102  CO2 27 27  GLUCOSE 108* 116*  BUN 15 15  CREATININE 1.37* 1.50*  CALCIUM 8.2* 8.3*     PT/INR:  Lab Results  Component Value Date   INR 1.6 (H) 06/02/2021   INR 1.7 (H) 06/01/2021   INR 1.2 05/30/2021   ABG:  INR: Will add last result for INR, ABG once components are confirmed Will add last 4 CBG results once components are  confirmed  Assessment/Plan:  1. CV - SR with HR high 90's. On Bystolic 5 mg daily as taken prior to surgery. With HR more in the high 90's (at rest), will discuss with Dr. Cyndia Bent if should increase Bystolic to 10 mg daily.  Will start Plavix 75 mg daily, decrease ec asa to 81 mg daily for diffuse CAD. 2.  Pulmonary - On room air. Encourage incentive spirometer. 3. Volume Overload - On Lasix 40 mg daily 4.  Expected post op acute blood loss anemia - Last H and H stable at 10.7 and 31.5 5. CKD (stage IIIA)-Creatinine slightly increased from 1.37 to 1.5. Creatinine prior to surgery 1.54. Chronic Kidney Disease   Stage I     GFR >90  Stage II    GFR 60-89  Stage IIIA GFR 45-59  Stage IIIB GFR 30-44  Stage IV   GFR 15-29  Stage V    GFR  <15  Lab Results  Component Value Date   CREATININE 1.50 (H) 06/05/2021   Estimated Creatinine Clearance: 48.9 mL/min (A) (by C-G formula based on SCr of 1.5 mg/dL (H)).  6. Thrombocytopenia-Last platelets slightly increased to 92,000 7. History of hypothyroidism-continue Levothyroxine 50 mcg daily 8. Likely discharge in am  Cristian M ZimmermanPA-C 06/05/2021,6:57 AM      Chart reviewed, Cristian Cantrell examined, agree with above. He feels well. Will  increase Bystolic to 10 mg daily. Wt still above preop so continue diuresis. Creat is at his baseline of 1.5.  Plan home tomorrow if no changes.

## 2021-06-05 NOTE — Anesthesia Postprocedure Evaluation (Signed)
Anesthesia Post Note  Patient: Cristian Cantrell  Procedure(s) Performed: CORONARY ARTERY BYPASS GRAFTING (CABG) X 4   USING LEFT INTERNAL MAMMARY ARTERY AND RIGHT ENDOSCOPIC GREATER SAPHENOUS VEIN CONDUITS (Chest) TRANSESOPHAGEAL ECHOCARDIOGRAM (TEE) (Esophagus) APPLICATION OF CELL SAVER ENDOVEIN HARVEST OF GREATER SAPHENOUS VEIN (Right)     Patient location during evaluation: SICU Anesthesia Type: General Level of consciousness: sedated Pain management: pain level controlled Vital Signs Assessment: post-procedure vital signs reviewed and stable Respiratory status: patient remains intubated per anesthesia plan Cardiovascular status: stable Postop Assessment: no apparent nausea or vomiting Anesthetic complications: no   No notable events documented.  Last Vitals:  Vitals:   06/05/21 0854 06/05/21 1135  BP: 111/74 114/71  Pulse:  83  Resp: 16 16  Temp:  36.8 C  SpO2:  93%    Last Pain:  Vitals:   06/05/21 1135  TempSrc: Oral  PainSc:                  Cristian Cantrell

## 2021-06-06 MED ORDER — POTASSIUM CHLORIDE CRYS ER 20 MEQ PO TBCR: 20.0000 meq | EXTENDED_RELEASE_TABLET | Freq: Every day | ORAL | 0 refills | Status: DC

## 2021-06-06 MED ORDER — CLOPIDOGREL BISULFATE 75 MG PO TABS: 75.0000 mg | ORAL_TABLET | Freq: Every day | ORAL | 1 refills | Status: DC

## 2021-06-06 MED ORDER — FUROSEMIDE 40 MG PO TABS: 40.0000 mg | ORAL_TABLET | Freq: Every day | ORAL | 0 refills | Status: DC

## 2021-06-06 MED ORDER — NEBIVOLOL HCL 10 MG PO TABS: 10.0000 mg | ORAL_TABLET | Freq: Every day | ORAL | 1 refills | Status: AC

## 2021-06-06 MED ORDER — TRAMADOL HCL 50 MG PO TABS: 50.0000 mg | ORAL_TABLET | Freq: Four times a day (QID) | ORAL | 0 refills | Status: DC | PRN

## 2021-06-06 MED FILL — Sodium Chloride IV Soln 0.9%: INTRAVENOUS | Qty: 2000 | Status: AC

## 2021-06-06 MED FILL — Tranexamic Acid IV Soln 1000 MG/10ML (100 MG/ML): INTRAVENOUS | Qty: 10 | Status: AC

## 2021-06-06 MED FILL — Potassium Chloride Inj 2 mEq/ML: INTRAVENOUS | Qty: 40 | Status: AC

## 2021-06-06 MED FILL — Heparin Sodium (Porcine) Inj 1000 Unit/ML: INTRAMUSCULAR | Qty: 10 | Status: AC

## 2021-06-06 MED FILL — Sodium Chloride IV Soln 0.9%: INTRAVENOUS | Qty: 100 | Status: AC

## 2021-06-06 MED FILL — Mannitol IV Soln 20%: INTRAVENOUS | Qty: 500 | Status: AC

## 2021-06-06 MED FILL — Electrolyte-R (PH 7.4) Solution: INTRAVENOUS | Qty: 4000 | Status: AC

## 2021-06-06 MED FILL — Heparin Sodium (Porcine) Inj 1000 Unit/ML: Qty: 1000 | Status: AC

## 2021-06-06 MED FILL — Heparin Sodium (Porcine) Inj 1000 Unit/ML: INTRAMUSCULAR | Qty: 5000 | Status: AC

## 2021-06-06 MED FILL — Albumin, Human Inj 5%: INTRAVENOUS | Qty: 250 | Status: AC

## 2021-06-06 MED FILL — Sodium Bicarbonate IV Soln 8.4%: INTRAVENOUS | Qty: 50 | Status: AC

## 2021-06-06 NOTE — Progress Notes (Signed)
CARDIAC REHAB PHASE I   D/c education completed with pt and spouse. Pt educated on importance of site care and monitoring incisions daily. Encouraged continued IS use, walks, and sternal precautions. Pt given in-the-tube sheet along with heart healthy diet. Reviewed restrictions and exercise guidelines. Will refer to CRP II [place] Rufina Falco, RN BSN 06/06/2021 8:26 AM

## 2021-06-06 NOTE — Progress Notes (Signed)
Patient being discharged home.  IV removed with the catheter intact.  Patient to be transported by his wife.

## 2021-06-06 NOTE — Progress Notes (Addendum)
° °   °  [address]       [place] [address]             [phone]        5 Days Post-Op Procedure(s) (LRB): CORONARY ARTERY BYPASS GRAFTING (CABG) X 4   USING LEFT INTERNAL MAMMARY ARTERY AND RIGHT ENDOSCOPIC GREATER SAPHENOUS VEIN CONDUITS (N/A) TRANSESOPHAGEAL ECHOCARDIOGRAM (TEE) (N/A) APPLICATION OF CELL SAVER ENDOVEIN HARVEST OF GREATER SAPHENOUS VEIN (Right)  Subjective: Patient feels well   Objective: Vital signs in last 24 hours: Temp:  [97.5 F (36.4 C)-98.6 F (37 C)] 98.4 F (36.9 C) (12/14 0349) Pulse Rate:  [68-96] 68 (12/14 0349) Cardiac Rhythm: Normal sinus rhythm (12/13 1906) Resp:  [15-18] 16 (12/14 0349) BP: (111-156)/(71-92) 126/83 (12/14 0349) SpO2:  [93 %-98 %] 98 % (12/14 0349)  Pre op weight  81.6 kg Current Weight  06/05/21 88.3 kg       Intake/Output from previous day: 12/13 0701 - 12/14 0700 In: 360 [P.O.:360] Out: 0    Physical Exam:  Cardiovascular: RRR Pulmonary: Clear to auscultation bilaterally; Abdomen: Soft, non tender, bowel sounds present. Extremities: Mild bilateral lower extremity edema. Wounds: Sternal dressing removed and wound is clean and dry.  No erythema or signs of infection. RLE wounds are clean, dry.  Lab Results: CBC: Recent Labs    06/04/21 0221  WBC 8.2  HGB 10.7*  HCT 31.5*  PLT 92*    BMET:  Recent Labs    06/04/21 0221 06/05/21 0204  NA 138 136  K 3.7 4.1  CL 104 102  CO2 27 27  GLUCOSE 108* 116*  BUN 15 15  CREATININE 1.37* 1.50*  CALCIUM 8.2* 8.3*     PT/INR:  Lab Results  Component Value Date   INR 1.6 (H) 06/02/2021   INR 1.7 (H) 06/01/2021   INR 1.2 05/30/2021   ABG:  INR: Will add last result for INR, ABG once components are confirmed Will add last 4 CBG results once components are confirmed  Assessment/Plan:  1. CV - SR with HR high 90's. On Bystolic 10 mg daily, Plavix 75 mg daily, and  ec asa to 81 mg daily for diffuse CAD. 2.  Pulmonary - On  room air. Encourage incentive spirometer. 3. Volume Overload - On Lasix 40 mg daily. I doubt weight is correct as had negative output for days. Will continue for several days at discharge then stop. 4.  Expected post op acute blood loss anemia - Last H and H stable at 10.7 and 31.5 5. CKD (stage IIIA)-Creatinine yesterday slightly increased from 1.37 to 1.5. Creatinine prior to surgery 1.54. Chronic Kidney Disease   Stage I     GFR >90  Stage II    GFR 60-89  Stage IIIA GFR 45-59  Stage IIIB GFR 30-44  Stage IV   GFR 15-29  Stage V    GFR  <15  Lab Results  Component Value Date   CREATININE 1.50 (H) 06/05/2021   Estimated Creatinine Clearance: 48.9 mL/min (A) (by C-G formula based on SCr of 1.5 mg/dL (H)).  6. Thrombocytopenia-Last platelets slightly increased to 92,000 7. History of hypothyroidism-continue Levothyroxine 50 mcg daily 8. Discharge;chest tube sutures to remain as will remove in office after discharge  Sharalyn Ink [facility] 06/06/2021,7:01 AM    Chart reviewed, patient examined, agree with above. He looks good and can go home today.

## 2021-06-06 NOTE — Plan of Care (Signed)
°  Problem: Education: Goal: Knowledge of disease or condition will improve Outcome: Progressing   Problem: Activity: Goal: Risk for activity intolerance will decrease Outcome: Progressing   Problem: Respiratory: Goal: Respiratory status will improve Outcome: Progressing   Problem: Education: Goal: Knowledge of General Education information will improve Description: Including pain rating scale, medication(s)/side effects and non-pharmacologic comfort measures Outcome: Progressing   Problem: Clinical Measurements: Goal: Respiratory complications will improve Outcome: Progressing   Problem: Activity: Goal: Risk for activity intolerance will decrease Outcome: Progressing

## 2021-06-06 NOTE — Plan of Care (Signed)
°  Problem: Education: Goal: Will demonstrate proper wound care and an understanding of methods to prevent future damage Outcome: Adequate for Discharge Goal: Knowledge of disease or condition will improve 06/06/2021 1049 by Caroll Rancher, RN Outcome: Adequate for Discharge 06/06/2021 3428 by Caroll Rancher, RN Outcome: Progressing Goal: Knowledge of the prescribed therapeutic regimen will improve Outcome: Adequate for Discharge Goal: Individualized Educational Video(s) Outcome: Adequate for Discharge   Problem: Activity: Goal: Risk for activity intolerance will decrease 06/06/2021 1049 by Caroll Rancher, RN Outcome: Adequate for Discharge 06/06/2021 7681 by Caroll Rancher, RN Outcome: Progressing   Problem: Cardiac: Goal: Will achieve and/or maintain hemodynamic stability Outcome: Adequate for Discharge   Problem: Respiratory: Goal: Respiratory status will improve 06/06/2021 1049 by Caroll Rancher, RN Outcome: Adequate for Discharge 06/06/2021 1572 by Caroll Rancher, RN Outcome: Progressing   Problem: Skin Integrity: Goal: Wound healing without signs and symptoms of infection Outcome: Adequate for Discharge Goal: Risk for impaired skin integrity will decrease Outcome: Adequate for Discharge   Problem: Urinary Elimination: Goal: Ability to achieve and maintain adequate renal perfusion and functioning will improve Outcome: Adequate for Discharge

## 2021-06-06 NOTE — Plan of Care (Signed)
Problem: Education: Goal: Will demonstrate proper wound care and an understanding of methods to prevent future damage 06/06/2021 1119 by Lurline Idol, RN Outcome: Adequate for Discharge 06/06/2021 1118 by Lurline Idol, RN Outcome: Adequate for Discharge Goal: Knowledge of disease or condition will improve 06/06/2021 1119 by Lurline Idol, RN Outcome: Adequate for Discharge 06/06/2021 1118 by Lurline Idol, RN Outcome: Adequate for Discharge Goal: Knowledge of the prescribed therapeutic regimen will improve 06/06/2021 1119 by Lurline Idol, RN Outcome: Adequate for Discharge 06/06/2021 1118 by Lurline Idol, RN Outcome: Adequate for Discharge Goal: Individualized Educational Video(s) 06/06/2021 1119 by Lurline Idol, RN Outcome: Adequate for Discharge 06/06/2021 1118 by Lurline Idol, RN Outcome: Adequate for Discharge   Problem: Activity: Goal: Risk for activity intolerance will decrease 06/06/2021 1119 by Lurline Idol, RN Outcome: Adequate for Discharge 06/06/2021 1118 by Lurline Idol, RN Outcome: Adequate for Discharge   Problem: Cardiac: Goal: Will achieve and/or maintain hemodynamic stability 06/06/2021 1119 by Lurline Idol, RN Outcome: Adequate for Discharge 06/06/2021 1118 by Lurline Idol, RN Outcome: Adequate for Discharge   Problem: Clinical Measurements: Goal: Postoperative complications will be avoided or minimized 06/06/2021 1119 by Lurline Idol, RN Outcome: Adequate for Discharge 06/06/2021 1118 by Lurline Idol, RN Outcome: Adequate for Discharge   Problem: Respiratory: Goal: Respiratory status will improve 06/06/2021 1119 by Lurline Idol, RN Outcome: Adequate for Discharge 06/06/2021 1118 by Lurline Idol, RN Outcome: Adequate for Discharge   Problem: Skin Integrity: Goal: Wound healing without signs and symptoms of infection 06/06/2021 1119 by Lurline Idol, RN Outcome: Adequate for Discharge 06/06/2021 1118 by Lurline Idol, RN Outcome: Adequate for Discharge Goal: Risk for impaired skin integrity will decrease 06/06/2021 1119 by Lurline Idol, RN Outcome: Adequate for Discharge 06/06/2021 1118 by Lurline Idol, RN Outcome: Adequate for Discharge   Problem: Urinary Elimination: Goal: Ability to achieve and maintain adequate renal perfusion and functioning will improve 06/06/2021 1119 by Lurline Idol, RN Outcome: Adequate for Discharge 06/06/2021 1118 by Lurline Idol, RN Outcome: Adequate for Discharge   Problem: Education: Goal: Knowledge of General Education information will improve Description: Including pain rating scale, medication(s)/side effects and non-pharmacologic comfort measures 06/06/2021 1119 by Lurline Idol, RN Outcome: Adequate for Discharge 06/06/2021 1118 by Lurline Idol, RN Outcome: Adequate for Discharge   Problem: Health Behavior/Discharge Planning: Goal: Ability to manage health-related needs will improve 06/06/2021 1119 by Lurline Idol, RN Outcome: Adequate for Discharge 06/06/2021 1118 by Lurline Idol, RN Outcome: Adequate for Discharge   Problem: Clinical Measurements: Goal: Ability to maintain clinical measurements within normal limits will improve 06/06/2021 1119 by Lurline Idol, RN Outcome: Adequate for Discharge 06/06/2021 1118 by Lurline Idol, RN Outcome: Adequate for Discharge Goal: Will remain free from infection 06/06/2021 1119 by Lurline Idol, RN Outcome: Adequate for Discharge 06/06/2021 1118 by Lurline Idol, RN Outcome: Adequate for Discharge Goal: Diagnostic test results will improve 06/06/2021 1119 by Lurline Idol, RN Outcome: Adequate for Discharge 06/06/2021 1118 by Lurline Idol, RN Outcome: Adequate for Discharge Goal: Respiratory complications will improve 06/06/2021 1119 by Lurline Idol, RN Outcome: Adequate for Discharge 06/06/2021 1118 by Lurline Idol, RN Outcome: Adequate for Discharge Goal:  Cardiovascular complication will be avoided 06/06/2021 1119 by Lurline Idol, RN Outcome: Adequate for Discharge 06/06/2021 1118 by Lurline Idol, RN Outcome: Adequate for Discharge   Problem: Activity:  Goal: Risk for activity intolerance will decrease 06/06/2021 1119 by Lurline Idol, RN Outcome: Adequate for Discharge 06/06/2021 1118 by Lurline Idol, RN Outcome: Adequate for Discharge   Problem: Nutrition: Goal: Adequate nutrition will be maintained 06/06/2021 1119 by Lurline Idol, RN Outcome: Adequate for Discharge 06/06/2021 1118 by Lurline Idol, RN Outcome: Adequate for Discharge   Problem: Coping: Goal: Level of anxiety will decrease 06/06/2021 1119 by Lurline Idol, RN Outcome: Adequate for Discharge 06/06/2021 1118 by Lurline Idol, RN Outcome: Adequate for Discharge   Problem: Elimination: Goal: Will not experience complications related to bowel motility 06/06/2021 1119 by Lurline Idol, RN Outcome: Adequate for Discharge 06/06/2021 1118 by Lurline Idol, RN Outcome: Adequate for Discharge Goal: Will not experience complications related to urinary retention 06/06/2021 1119 by Lurline Idol, RN Outcome: Adequate for Discharge 06/06/2021 1118 by Lurline Idol, RN Outcome: Adequate for Discharge   Problem: Pain Managment: Goal: General experience of comfort will improve 06/06/2021 1119 by Lurline Idol, RN Outcome: Adequate for Discharge 06/06/2021 1118 by Lurline Idol, RN Outcome: Adequate for Discharge   Problem: Safety: Goal: Ability to remain free from injury will improve 06/06/2021 1119 by Lurline Idol, RN Outcome: Adequate for Discharge 06/06/2021 1118 by Lurline Idol, RN Outcome: Adequate for Discharge   Problem: Skin Integrity: Goal: Risk for impaired skin integrity will decrease 06/06/2021 1119 by Lurline Idol, RN Outcome: Adequate for Discharge 06/06/2021 1118 by Lurline Idol, RN Outcome: Adequate for Discharge

## 2021-06-06 NOTE — Progress Notes (Signed)
Patient given discharge instructions and stated understanding. 

## 2021-06-15 ENCOUNTER — Ambulatory Visit (HOSPITAL_COMMUNITY)
Admission: RE | Admit: 2021-06-15 | Discharge: 2021-06-15 | Disposition: A | Discharge status: Home / Self Care | Payer: Medicare Other | Source: Ambulatory Visit | Attending: Physician Assistant | Admitting: Physician Assistant

## 2021-06-15 ENCOUNTER — Ambulatory Visit (INDEPENDENT_AMBULATORY_CARE_PROVIDER_SITE_OTHER): Payer: Self-pay | Admitting: *Deleted

## 2021-06-15 ENCOUNTER — Other Ambulatory Visit: Payer: Self-pay

## 2021-06-15 DIAGNOSIS — Z951 Presence of aortocoronary bypass graft: Secondary | ICD-10-CM | POA: Insufficient documentation

## 2021-06-15 DIAGNOSIS — Z4802 Encounter for removal of sutures: Secondary | ICD-10-CM

## 2021-06-15 NOTE — Progress Notes (Signed)
Patient arrived for nurse visit to remove sutures post-CABG 12/9 by Dr. Cyndia Bent.  Three sutures removed with no signs or symptoms of infection noted.  Incisions well approximated.  Patient tolerated suture removal well.  Patient and family instructed to keep the incision site clean and dry. Patient and family acknowledged instructions given.  All questions answered.    Patient with c/o of right calf soreness in EVH leg. No redness present. Right leg/calf slightly more swollen than left. Per E. Barrett, PA, venous duplex ordered of right leg.

## 2021-06-27 ENCOUNTER — Other Ambulatory Visit: Payer: Self-pay | Admitting: Surgery

## 2021-06-27 DIAGNOSIS — Z951 Presence of aortocoronary bypass graft: Secondary | ICD-10-CM

## 2021-07-04 ENCOUNTER — Ambulatory Visit
Admission: RE | Admit: 2021-07-04 | Discharge: 2021-07-04 | Disposition: A | Discharge status: Home / Self Care | Payer: Medicare HMO | Source: Ambulatory Visit | Attending: Surgery | Admitting: Surgery

## 2021-07-04 ENCOUNTER — Ambulatory Visit (INDEPENDENT_AMBULATORY_CARE_PROVIDER_SITE_OTHER): Payer: Self-pay | Admitting: Surgery

## 2021-07-04 ENCOUNTER — Encounter: Payer: Self-pay | Admitting: Surgery

## 2021-07-04 ENCOUNTER — Other Ambulatory Visit: Payer: Self-pay

## 2021-07-04 VITALS — BP 163/92 | HR 70 | Resp 20 | Ht 72.0 in | Wt 181.0 lb

## 2021-07-04 DIAGNOSIS — Z951 Presence of aortocoronary bypass graft: Secondary | ICD-10-CM

## 2021-07-04 DIAGNOSIS — J9811 Atelectasis: Secondary | ICD-10-CM | POA: Diagnosis not present

## 2021-07-04 DIAGNOSIS — J90 Pleural effusion, not elsewhere classified: Secondary | ICD-10-CM | POA: Diagnosis not present

## 2021-07-04 NOTE — Progress Notes (Signed)
° °  HPI: Patient returns for routine postoperative follow-up having undergone CABG x 4 on 06/01/2021. The patient's early postoperative recovery while in the hospital was notable for an uncomplicated postop course. Since hospital discharge the patient reports that he has been feeling well. He has been walking but mostly in the house.   Current Outpatient Medications  Medication Sig Dispense Refill   aspirin EC 81 MG tablet Take 1 tablet (81 mg total) by mouth daily. 30 tablet 3   cholecalciferol (VITAMIN D) 1000 UNITS tablet Take 1,000 Units by mouth daily.     clopidogrel (PLAVIX) 75 MG tablet Take 1 tablet (75 mg total) by mouth daily. 30 tablet 1   levothyroxine (SYNTHROID, LEVOTHROID) 50 MCG tablet Take 50 mcg by mouth daily before breakfast.     nebivolol (BYSTOLIC) 10 MG tablet Take 1 tablet (10 mg total) by mouth daily. 30 tablet 1   pantoprazole (PROTONIX) 40 MG tablet Take 1 tablet (40 mg total) by mouth daily. 30 tablet 3   rosuvastatin (CRESTOR) 40 MG tablet Take 1 tablet (40 mg total) by mouth daily. 90 tablet 3   tamsulosin (FLOMAX) 0.4 MG CAPS capsule Take 0.4 mg by mouth daily.     traMADol (ULTRAM) 50 MG tablet Take 1 tablet (50 mg total) by mouth every 6 (six) hours as needed for moderate pain. 30 tablet 0   No current facility-administered medications for this visit.    Physical Exam: BP (!) 163/92 (BP Location: Left Arm, Patient Position: Sitting)    Pulse 70    Resp 20    Ht 6' (1.829 m)    Wt 181 lb (82.1 kg)    SpO2 96% Comment: RA   BMI 24.55 kg/m  He looks well Cardiac exam shows a regular rate and rhythm with normal heart sounds  Lungs are clear The chest incision is healing well and the sternum is stable. The right leg incisions are healing well.  Diagnostic Tests:  Narrative & Impression  CLINICAL DATA:  CABG.   EXAM: CHEST - 2 VIEW   COMPARISON:  06/04/2021.   FINDINGS: Trachea is midline. Heart size normal. Linear subsegmental atelectasis in the  lingula and left lower lobe. Lungs are otherwise clear. Small left pleural effusion.   IMPRESSION: Small left pleural effusion.     Electronically Signed   By: Lorin Picket M.D.   On: 07/04/2021 15:58      Impression:  Overall I think he is making good progress 1 month following surgery.  I encouraged him to continue walking as much as possible.  I think he can start cardiac rehab at this time.  He started driving yesterday which is fine.  I asked him not to lift anything heavier than 10 pounds for 3 months postoperatively.  Plan:  He will continue to follow-up with cardiology and will return to see me if he has any problems with his incisions.   Gaye Pollack, MD [facility] [phone]

## 2021-07-05 ENCOUNTER — Ambulatory Visit: Payer: 59 | Admitting: Cardiology

## 2021-07-10 LAB — ECHO INTRAOPERATIVE TEE
AR max vel: 2.48 cm2
AV Area VTI: 1.91 cm2
AV Area mean vel: 2.27 cm2
AV Mean grad: 4 mmHg
AV Peak grad: 7.2 mmHg
Ao pk vel: 1.34 m/s
Height: 72 in
MV VTI: 4.18 cm2
Weight: 2880 oz

## 2021-07-17 ENCOUNTER — Other Ambulatory Visit: Payer: Self-pay | Admitting: Physician Assistant

## 2021-07-17 NOTE — Progress Notes (Addendum)
Patient referred by Burnard Bunting, MD for chest pain  Subjective:   Cristian Cantrell, male    DOB: January 28, 1951, 71 y.o.   MRN: [id number]   Chief Complaint  Patient presents with   Chest Pain   Hospitalization Follow-up     HPI  71 y.o. Caucasian male with hypertension, hyperlipidemia, remote h/o gastric ulcer, multivessel CAD, now s/p CABGX4 (LIMA-LAD, SVG-diag, SVG-OM, SVG-PDA) 05/2021  Patient has not had anginal symptoms that he experienced prior to surgery, since associated.  He has constant chest wall pain, especially on laying on his side, since his surgery.  Scar is healing well.  3 days ago, he had an episode of left shoulder pain that lasted all day, worse with certain movements, resolved next day.  He wonders if this pain was related to his heart.  Blood pressure is elevated today.  Losartan and amlodipine were discontinued on hospital discharge.  He is currently on aspirin and Plavix and tolerating well without any bleeding issues.  LDL is down to 73 as of 05/08/2021.    Current Outpatient Medications on File Prior to Visit  Medication Sig Dispense Refill   aspirin EC 81 MG tablet Take 1 tablet (81 mg total) by mouth daily. 30 tablet 3   cholecalciferol (VITAMIN D) 1000 UNITS tablet Take 1,000 Units by mouth daily.     clopidogrel (PLAVIX) 75 MG tablet Take 1 tablet (75 mg total) by mouth daily. 30 tablet 1   levothyroxine (SYNTHROID, LEVOTHROID) 50 MCG tablet Take 50 mcg by mouth daily before breakfast.     nebivolol (BYSTOLIC) 10 MG tablet Take 1 tablet (10 mg total) by mouth daily. 30 tablet 1   pantoprazole (PROTONIX) 40 MG tablet Take 1 tablet (40 mg total) by mouth daily. 30 tablet 3   rosuvastatin (CRESTOR) 40 MG tablet Take 1 tablet (40 mg total) by mouth daily. 90 tablet 3   tamsulosin (FLOMAX) 0.4 MG CAPS capsule Take 0.4 mg by mouth daily.     traMADol (ULTRAM) 50 MG tablet Take 1 tablet (50 mg total) by mouth every 6 (six) hours as needed for moderate pain.  30 tablet 0   No current facility-administered medications on file prior to visit.    Cardiovascular and other pertinent studies:  EKG 07/18/2021: Sinus rhythm 66 bpm Occasional ectopic ventricular beat    Anterolateral T wave inversion, consider ischemia  06/01/2021: CABGX4 (Dr Cyndia Bent) Left internal mammary artery graft to the LAD SVG  to diagonal SVG to OM SVG to PDA  Coronary angiography 05/15/2021: LM: Normal LAD: Tortuous vessel          Prox 95% stenosis w/moderate calcification          Mid eccentric 80% stenosis w/moderate-severe calcification adjacent to medium sized diag 2, followed by a focal hazy 70% stenosis adjacent to small sized diag 3 Lcx: Prox focal 95% stenosis w/mild calcification        Distal Lcx occlusion (small vessel), left-to-left collaterals from distal LAD RCA: Prox 70%, followed by 90% stenosis adjacent to small sized RV marginal that has ostial 95% stenosis   Intermediate complexity multivessel CAD, nondiabetic, normal LVEF Discuss CABG vs complex multivessel PCI for symptom improvement Will refer to CVTS Continue current medical management  Echocardiogram 04/16/2021:  Left ventricle cavity is normal in size and wall thickness. Normal global  wall motion. Normal LV systolic function with EF 61%. Normal diastolic  filling pattern.  Left atrial cavity is mildly dilated.  Structurally normal trileaflet  aortic valve. Trace aortic regurgitation.  Mild pulmonic regurgitation.  Normal right atrial pressure.   Exercise Tetrofosmin stress test 04/16/2021: Exercise nuclear stress test was performed using Bruce protocol. Patient reached 10.1 METS, and 98% of age predicted maximum heart rate. Exercise capacity was good. No chest pain reported, dyspnea reported. Heart rate and hemodynamic response were normal. Stress EKG showed sinus tachycardia, frequent PVC's, 1.5-2 mm horizontal ST depression in leads V5, V6 persisting beyond 2 min into recovery.  SPECT  images show medium sized, medium intensity, partially reversible perfusion defect in apical to basal, inferior/inferoseptal myocardium. Stress LVEF 50%. Intermediate risk study.   EKG 03/30/2021: Sinus rhythm 59 bpm First degree AV block Nonspecific T wave inversion inferior leads  Echocardiogram 2016: - Left ventricle: The cavity size was normal. Systolic function was    normal. The estimated ejection fraction was in the range of 60%    to 65%. Wall motion was normal; there were no regional wall    motion abnormalities. Left ventricular diastolic function    parameters were normal.  - Aortic valve: Trileaflet; normal thickness leaflets. There was no    regurgitation.  - Aortic root: The aortic root was normal in size.  - Right ventricle: The cavity size was normal. Wall thickness was    normal. Systolic function was normal.  - Tricuspid valve: There was trivial regurgitation.  - Pulmonic valve: There was no regurgitation.  - Pulmonary arteries: Systolic pressure was at the upper limits of    normal. PA peak pressure: 36 mm Hg (S).  - Inferior vena cava: The vessel was normal in size.  - Pericardium, extracardiac: There was no pericardial effusion.   Stress test 2016: This is an intermediate risk study, abnormal. There is a medium defect of mild severity present in the mid anteroseptal, apical anterior and apical septal location. The defect is partially reversible suggestive of ischemia Significant ST segment depression, horizontal to upsloping seen at peak stress, 2 mm inferolateral leads, promptly resolving 2 minutes in the recovery  Cardiac MRI 2016: 1.  Normal left ventricular size and systolic function, EF 16%. 2. Normal right ventricular size and systolic function. No evidence on this study for ARVC. 3. No myocardial LGE. Therefore, no definitive evidence for prior MI, infiltrative disease, or myocarditis. From this study, there is no indication of prior MI but cannot rule  out ischemia (Cardiolite reviewed, suggested MI with peri-infarct ischemia).     Recent labs: 05/26/2021: Glucose 116, BUN/Cr 15/1.50. EGFR 49. Na/K 136/4.1 H/H 10.7/31.5. MCV 93. Platelets 92  05/08/2021: Chol 125, TG 74, HDL 37, LDL 73  03/27/2021: Glucose 97, BUN/Cr 16/1.4. EGFR 49. Na/K 138/4.3. Rest of the CMP normal H/H 14/42. MCV 91. Platelets 174 HbA1C N/A Chol 196, TG 137, HDL 32, LDL 137 TSH 3.1 normal    Review of Systems  Cardiovascular:  Positive for chest pain and dyspnea on exertion. Negative for leg swelling, palpitations and syncope.  Gastrointestinal:  Positive for heartburn.        Vitals:   07/18/21 1124 07/18/21 1128  BP: (!) 172/95 (!) 172/102  Pulse: 63 66  SpO2: 95%      Body mass index is 24.82 kg/m. Filed Weights   07/18/21 1124  Weight: 183 lb (83 kg)     Objective:   Physical Exam Vitals and nursing note reviewed.  Constitutional:      General: He is not in acute distress. Neck:     Vascular: No JVD.  Cardiovascular:  Rate and Rhythm: Normal rate and regular rhythm.     Heart sounds: Normal heart sounds. No murmur heard. Pulmonary:     Effort: Pulmonary effort is normal.     Breath sounds: Normal breath sounds. No wheezing or rales.  Musculoskeletal:     Right lower leg: No edema.     Left lower leg: No edema.           Assessment & Recommendations:    71 y.o. Caucasian male with hypertension, hyperlipidemia, remote h/o gastric ulcer, multivessel CAD, now s/p CABGX4 (LIMA-LAD, SVG-diag, SVG-OM, SVG-PDA) 05/2021  CAD: Multivessel CAD, now s/p CABGX4 (LIMA-LAD, SVG-diag, SVG-OM, SVG-PDA) 05/2021 Chest wall pain likely from sternotomy. No angina symptoms.  Continue aspirin and Plavix for 6 months post CABG for improved graft patency (till 11/2020), in absence of significant bleeding issues. He has mild asymptomatic thrombocytopenia post op. Will recheck CBC . See below regarding blood pressure control. LDL 73, HDL  37 on Crestor 40 mg.  Okay to continue the same for now.  We will repeat lipid panel in 2 weeks.  If LDL remains >70, could add Zetia 10 mg daily. Okay to start cardiac rehab  Hypertension: Uncontrolled.  Currently on nebivolol 10 mg daily. Not on losartan 100 mg daily, amlodipine 5 mg daily that he was on prior to CABG. Creatinine 1.5, but overall stable. Recommend resuming losartan at 50 mg daily, and amlodipine 5 mg daily. Encourage hydration. Check BMP in 2 weeks.  F/u in 4 weeks   Nigel Mormon, MD Pager: [phone] Office: [phone]

## 2021-07-18 ENCOUNTER — Encounter: Payer: Self-pay | Admitting: Cardiology

## 2021-07-18 ENCOUNTER — Other Ambulatory Visit: Payer: Self-pay

## 2021-07-18 ENCOUNTER — Ambulatory Visit: Payer: Medicare HMO | Admitting: Cardiology

## 2021-07-18 VITALS — BP 172/102 | HR 66 | Ht 72.0 in | Wt 183.0 lb

## 2021-07-18 DIAGNOSIS — I25118 Atherosclerotic heart disease of native coronary artery with other forms of angina pectoris: Secondary | ICD-10-CM | POA: Diagnosis not present

## 2021-07-18 DIAGNOSIS — I10 Essential (primary) hypertension: Secondary | ICD-10-CM | POA: Diagnosis not present

## 2021-07-18 DIAGNOSIS — D696 Thrombocytopenia, unspecified: Secondary | ICD-10-CM | POA: Diagnosis not present

## 2021-07-18 DIAGNOSIS — E782 Mixed hyperlipidemia: Secondary | ICD-10-CM

## 2021-07-18 MED ORDER — NITROGLYCERIN 0.4 MG SL SUBL: 0.4000 mg | SUBLINGUAL_TABLET | SUBLINGUAL | 3 refills | Status: AC | PRN

## 2021-07-18 NOTE — Addendum Note (Signed)
Addended by: Nigel Mormon on: 07/18/2021 01:15 PM   Modules accepted: Orders

## 2021-07-18 NOTE — Addendum Note (Signed)
Addended by: Nigel Mormon on: 07/18/2021 01:22 PM   Modules accepted: Orders

## 2021-07-30 DIAGNOSIS — I10 Essential (primary) hypertension: Secondary | ICD-10-CM | POA: Diagnosis not present

## 2021-07-30 DIAGNOSIS — N390 Urinary tract infection, site not specified: Secondary | ICD-10-CM | POA: Diagnosis not present

## 2021-07-30 DIAGNOSIS — N1831 Chronic kidney disease, stage 3a: Secondary | ICD-10-CM | POA: Diagnosis not present

## 2021-07-30 DIAGNOSIS — E663 Overweight: Secondary | ICD-10-CM | POA: Diagnosis not present

## 2021-07-30 DIAGNOSIS — D638 Anemia in other chronic diseases classified elsewhere: Secondary | ICD-10-CM | POA: Diagnosis not present

## 2021-07-30 DIAGNOSIS — R869 Unspecified abnormal finding in specimens from male genital organs: Secondary | ICD-10-CM | POA: Diagnosis not present

## 2021-07-31 ENCOUNTER — Telehealth: Payer: Self-pay | Admitting: Cardiology

## 2021-07-31 NOTE — Telephone Encounter (Signed)
Patient requesting refill for plavix and protonix.

## 2021-08-01 ENCOUNTER — Other Ambulatory Visit: Payer: Self-pay

## 2021-08-01 DIAGNOSIS — I209 Angina pectoris, unspecified: Secondary | ICD-10-CM

## 2021-08-01 DIAGNOSIS — D696 Thrombocytopenia, unspecified: Secondary | ICD-10-CM | POA: Diagnosis not present

## 2021-08-01 DIAGNOSIS — I25118 Atherosclerotic heart disease of native coronary artery with other forms of angina pectoris: Secondary | ICD-10-CM | POA: Diagnosis not present

## 2021-08-01 LAB — CBC
Hematocrit: 40.1 % (ref 37.5–51.0)
Hemoglobin: 13.3 g/dL (ref 13.0–17.7)
MCH: 29.4 pg (ref 26.6–33.0)
MCHC: 33.2 g/dL (ref 31.5–35.7)
MCV: 89 fL (ref 79–97)
Platelets: 189 10*3/uL (ref 150–450)
RBC: 4.53 x10E6/uL (ref 4.14–5.80)
RDW: 13 % (ref 11.6–15.4)
WBC: 5.2 10*3/uL (ref 3.4–10.8)

## 2021-08-01 MED ORDER — PANTOPRAZOLE SODIUM 40 MG PO TBEC: 40.0000 mg | DELAYED_RELEASE_TABLET | Freq: Every day | ORAL | 3 refills | Status: DC

## 2021-08-01 MED ORDER — CLOPIDOGREL BISULFATE 75 MG PO TABS: 75.0000 mg | ORAL_TABLET | Freq: Every day | ORAL | 3 refills | Status: DC

## 2021-08-01 NOTE — Telephone Encounter (Signed)
Ok done

## 2021-08-02 LAB — BASIC METABOLIC PANEL
BUN/Creatinine Ratio: 7 — ABNORMAL LOW (ref 10–24)
BUN: 10 mg/dL (ref 8–27)
CO2: 25 mmol/L (ref 20–29)
Calcium: 9.4 mg/dL (ref 8.6–10.2)
Chloride: 105 mmol/L (ref 96–106)
Creatinine, Ser: 1.38 mg/dL — ABNORMAL HIGH (ref 0.76–1.27)
Glucose: 99 mg/dL (ref 70–99)
Potassium: 4.1 mmol/L (ref 3.5–5.2)
Sodium: 142 mmol/L (ref 134–144)
eGFR: 54 mL/min/{1.73_m2} — ABNORMAL LOW (ref >59)

## 2021-08-02 LAB — LIPID PANEL
Chol/HDL Ratio: 3 ratio (ref 0.0–5.0)
Cholesterol, Total: 111 mg/dL (ref 100–199)
HDL: 37 mg/dL — ABNORMAL LOW (ref >39)
LDL Chol Calc (NIH): 57 mg/dL (ref 0–99)
Triglycerides: 86 mg/dL (ref 0–149)
VLDL Cholesterol Cal: 17 mg/dL (ref 5–40)

## 2021-08-06 ENCOUNTER — Telehealth (HOSPITAL_COMMUNITY): Payer: Self-pay

## 2021-08-06 NOTE — Telephone Encounter (Signed)
Pt insurance is active and benefits verified through [facility]. Co-pay $10.00, DED $0.00/$0.00 met, out of pocket $3,400.00/$40.00 met, co-insurance 0%. No pre-authorization required. Passport, 08/06/21 @ 3:12PM, QQI#[id number]   Will contact patient to see if he is interested in the Cardiac Rehab Program.

## 2021-08-06 NOTE — Telephone Encounter (Signed)
Called patient to see if he was interested in participating in the Cardiac Rehab Program. Patient stated yes. Patient will come in for orientation on 08/28/21 @ 10:30AM and will attend the 10:30AM exercise class. Went over insurance, patient verbalized understanding.    Tourist information centre manager.

## 2021-08-15 ENCOUNTER — Other Ambulatory Visit: Payer: Self-pay

## 2021-08-15 ENCOUNTER — Encounter: Payer: Self-pay | Admitting: Cardiology

## 2021-08-15 ENCOUNTER — Ambulatory Visit: Payer: Medicare HMO | Admitting: Cardiology

## 2021-08-15 VITALS — BP 121/75 | HR 55 | Temp 98.2°F | Resp 16 | Ht 72.0 in | Wt 185.0 lb

## 2021-08-15 DIAGNOSIS — I10 Essential (primary) hypertension: Secondary | ICD-10-CM

## 2021-08-15 DIAGNOSIS — E782 Mixed hyperlipidemia: Secondary | ICD-10-CM | POA: Diagnosis not present

## 2021-08-15 DIAGNOSIS — I2510 Atherosclerotic heart disease of native coronary artery without angina pectoris: Secondary | ICD-10-CM

## 2021-08-15 MED ORDER — ROSUVASTATIN CALCIUM 40 MG PO TABS: 40.0000 mg | ORAL_TABLET | Freq: Every day | ORAL | 3 refills | Status: DC

## 2021-08-15 NOTE — Progress Notes (Signed)
Patient referred by Burnard Bunting, MD for chest pain  Subjective:   Cristian Cantrell, male    DOB: Jun 01, 1951, 71 y.o.   MRN: [id number]   Chief Complaint  Patient presents with   Coronary Artery Disease   Hypertension   Follow-up    4 weeks     HPI  70 y.o. Caucasian male with hypertension, hyperlipidemia, remote h/o gastric ulcer, multivessel CAD, now s/p CABGX4 (LIMA-LAD, SVG-diag, SVG-OM, SVG-PDA) 05/2021  Patient is doing well. He still has some chest wall pain post sternotomy, but denies any angina symptoms. BP well controlled. Reviewed recent test results with the patient, details below.    Current Outpatient Medications on File Prior to Visit  Medication Sig Dispense Refill   amLODipine (NORVASC) 5 MG tablet Take 5 mg by mouth daily.     aspirin EC 81 MG tablet Take 1 tablet (81 mg total) by mouth daily. 30 tablet 3   cholecalciferol (VITAMIN D) 1000 UNITS tablet Take 1,000 Units by mouth daily.     clopidogrel (PLAVIX) 75 MG tablet Take 1 tablet (75 mg total) by mouth daily. 30 tablet 3   levothyroxine (SYNTHROID, LEVOTHROID) 50 MCG tablet Take 50 mcg by mouth daily before breakfast.     losartan (COZAAR) 100 MG tablet Take 50 mg by mouth daily.     nebivolol (BYSTOLIC) 10 MG tablet Take 1 tablet (10 mg total) by mouth daily. 30 tablet 1   nitroGLYCERIN (NITROSTAT) 0.4 MG SL tablet Place 1 tablet (0.4 mg total) under the tongue every 5 (five) minutes as needed for chest pain. 30 tablet 3   pantoprazole (PROTONIX) 40 MG tablet Take 1 tablet (40 mg total) by mouth daily. 30 tablet 3   rosuvastatin (CRESTOR) 40 MG tablet Take 1 tablet (40 mg total) by mouth daily. 90 tablet 3   tamsulosin (FLOMAX) 0.4 MG CAPS capsule Take 0.4 mg by mouth daily.     traMADol (ULTRAM) 50 MG tablet Take 1 tablet (50 mg total) by mouth every 6 (six) hours as needed for moderate pain. 30 tablet 0   No current facility-administered medications on file prior to visit.    Cardiovascular  and other pertinent studies:  EKG 07/18/2021: Sinus rhythm 66 bpm Occasional ectopic ventricular beat    Anterolateral T wave inversion, consider ischemia  06/01/2021: CABGX4 (Dr Cyndia Bent) Left internal mammary artery graft to the LAD SVG  to diagonal SVG to OM SVG to PDA  Coronary angiography 05/15/2021: LM: Normal LAD: Tortuous vessel          Prox 95% stenosis w/moderate calcification          Mid eccentric 80% stenosis w/moderate-severe calcification adjacent to medium sized diag 2, followed by a focal hazy 70% stenosis adjacent to small sized diag 3 Lcx: Prox focal 95% stenosis w/mild calcification        Distal Lcx occlusion (small vessel), left-to-left collaterals from distal LAD RCA: Prox 70%, followed by 90% stenosis adjacent to small sized RV marginal that has ostial 95% stenosis   Intermediate complexity multivessel CAD, nondiabetic, normal LVEF Discuss CABG vs complex multivessel PCI for symptom improvement Will refer to CVTS Continue current medical management  Echocardiogram 04/16/2021:  Left ventricle cavity is normal in size and wall thickness. Normal global  wall motion. Normal LV systolic function with EF 61%. Normal diastolic  filling pattern.  Left atrial cavity is mildly dilated.  Structurally normal trileaflet aortic valve. Trace aortic regurgitation.  Mild pulmonic regurgitation.  Normal  right atrial pressure.   Exercise Tetrofosmin stress test 04/16/2021: Exercise nuclear stress test was performed using Bruce protocol. Patient reached 10.1 METS, and 98% of age predicted maximum heart rate. Exercise capacity was good. No chest pain reported, dyspnea reported. Heart rate and hemodynamic response were normal. Stress EKG showed sinus tachycardia, frequent PVC's, 1.5-2 mm horizontal ST depression in leads V5, V6 persisting beyond 2 min into recovery.  SPECT images show medium sized, medium intensity, partially reversible perfusion defect in apical to basal,  inferior/inferoseptal myocardium. Stress LVEF 50%. Intermediate risk study.   EKG 03/30/2021: Sinus rhythm 59 bpm First degree AV block Nonspecific T wave inversion inferior leads  Echocardiogram 2016: - Left ventricle: The cavity size was normal. Systolic function was    normal. The estimated ejection fraction was in the range of 60%    to 65%. Wall motion was normal; there were no regional wall    motion abnormalities. Left ventricular diastolic function    parameters were normal.  - Aortic valve: Trileaflet; normal thickness leaflets. There was no    regurgitation.  - Aortic root: The aortic root was normal in size.  - Right ventricle: The cavity size was normal. Wall thickness was    normal. Systolic function was normal.  - Tricuspid valve: There was trivial regurgitation.  - Pulmonic valve: There was no regurgitation.  - Pulmonary arteries: Systolic pressure was at the upper limits of    normal. PA peak pressure: 36 mm Hg (S).  - Inferior vena cava: The vessel was normal in size.  - Pericardium, extracardiac: There was no pericardial effusion.   Stress test 2016: This is an intermediate risk study, abnormal. There is a medium defect of mild severity present in the mid anteroseptal, apical anterior and apical septal location. The defect is partially reversible suggestive of ischemia Significant ST segment depression, horizontal to upsloping seen at peak stress, 2 mm inferolateral leads, promptly resolving 2 minutes in the recovery  Cardiac MRI 2016: 1.  Normal left ventricular size and systolic function, EF 41%. 2. Normal right ventricular size and systolic function. No evidence on this study for ARVC. 3. No myocardial LGE. Therefore, no definitive evidence for prior MI, infiltrative disease, or myocarditis. From this study, there is no indication of prior MI but cannot rule out ischemia (Cardiolite reviewed, suggested MI with peri-infarct ischemia).     Recent  labs: 08/01/2021: Glucose 99, BUN/Cr 10/1.38. EGFR 54. Na/K 142/4.1.  H/H 13/40. MCV 89. Platelets 189 Chol 111, TG 86, HDL 37, LDL 57  05/26/2021: Glucose 116, BUN/Cr 15/1.50. EGFR 49. Na/K 136/4.1 H/H 10.7/31.5. MCV 93. Platelets 92  05/08/2021: Chol 125, TG 74, HDL 37, LDL 73  03/27/2021: Glucose 97, BUN/Cr 16/1.4. EGFR 49. Na/K 138/4.3. Rest of the CMP normal H/H 14/42. MCV 91. Platelets 174 HbA1C N/A Chol 196, TG 137, HDL 32, LDL 137 TSH 3.1 normal    Review of Systems  Cardiovascular:  Positive for chest pain and dyspnea on exertion. Negative for leg swelling, palpitations and syncope.  Gastrointestinal:  Positive for heartburn.        Vitals:   08/15/21 1259  BP: 121/75  Pulse: (!) 55  Resp: 16  Temp: 98.2 F (36.8 C)  SpO2: 97%     Body mass index is 25.09 kg/m. Filed Weights   08/15/21 1259  Weight: 185 lb (83.9 kg)     Objective:   Physical Exam Vitals and nursing note reviewed.  Constitutional:      General: He  is not in acute distress. Neck:     Vascular: No JVD.  Cardiovascular:     Rate and Rhythm: Normal rate and regular rhythm.     Heart sounds: Normal heart sounds. No murmur heard. Pulmonary:     Effort: Pulmonary effort is normal.     Breath sounds: Normal breath sounds. No wheezing or rales.  Musculoskeletal:     Right lower leg: No edema.     Left lower leg: No edema.           Assessment & Recommendations:    71 y.o. Caucasian male with hypertension, hyperlipidemia, remote h/o gastric ulcer, multivessel CAD, now s/p CABGX4 (LIMA-LAD, SVG-diag, SVG-OM, SVG-PDA) 05/2021  CAD: Multivessel CAD, now s/p CABGX4 (LIMA-LAD, SVG-diag, SVG-OM, SVG-PDA) 05/2021 Chest wall pain likely from sternotomy. No angina symptoms.  Continue aspirin and Plavix for 6 months post CABG for improved graft patency (till 11/2021), in absence of significant bleeding issues. After that, could stop plavix and start Xarelto 2.5 mg bid. BP well  controlled. LDL fown to 57 on Crestor 40 mg. Okay to start cardiac rehab  Hypertension: Now well controlled. On nebivolol 10 mg daily, losartan 50 mg (takes 1/2 of 100 mg), amlodipine 5 mg. Renal function stable. In future, losartan can be refilled at 50 mg 1 tab daily.  Creatinine 1.5, but overall stable.  Thrombocytopenia: Resolved  F/u in 6 months    Nigel Mormon, MD Pager: [phone] Office: [phone]

## 2021-08-22 DIAGNOSIS — L570 Actinic keratosis: Secondary | ICD-10-CM | POA: Diagnosis not present

## 2021-08-22 DIAGNOSIS — L821 Other seborrheic keratosis: Secondary | ICD-10-CM | POA: Diagnosis not present

## 2021-08-22 DIAGNOSIS — Z85828 Personal history of other malignant neoplasm of skin: Secondary | ICD-10-CM | POA: Diagnosis not present

## 2021-08-22 DIAGNOSIS — C4441 Basal cell carcinoma of skin of scalp and neck: Secondary | ICD-10-CM | POA: Diagnosis not present

## 2021-08-22 DIAGNOSIS — D225 Melanocytic nevi of trunk: Secondary | ICD-10-CM | POA: Diagnosis not present

## 2021-08-22 DIAGNOSIS — L814 Other melanin hyperpigmentation: Secondary | ICD-10-CM | POA: Diagnosis not present

## 2021-08-22 DIAGNOSIS — C44319 Basal cell carcinoma of skin of other parts of face: Secondary | ICD-10-CM | POA: Diagnosis not present

## 2021-08-22 DIAGNOSIS — D1801 Hemangioma of skin and subcutaneous tissue: Secondary | ICD-10-CM | POA: Diagnosis not present

## 2021-08-27 ENCOUNTER — Telehealth (HOSPITAL_COMMUNITY): Payer: Self-pay | Admitting: *Deleted

## 2021-08-27 NOTE — Telephone Encounter (Signed)
Spoke with the patient. Confirmed appointment. Completed health history.Barnet Pall, RN,BSN ?08/27/2021 2:27 PM  ?

## 2021-08-28 ENCOUNTER — Encounter (HOSPITAL_COMMUNITY): Payer: Self-pay

## 2021-08-28 ENCOUNTER — Other Ambulatory Visit: Payer: Self-pay

## 2021-08-28 ENCOUNTER — Encounter (HOSPITAL_COMMUNITY)
Admission: RE | Admit: 2021-08-28 | Discharge: 2021-08-28 | Disposition: A | Discharge status: Home / Self Care | Payer: Medicare HMO | Source: Ambulatory Visit | Attending: Cardiology | Admitting: Cardiology

## 2021-08-28 VITALS — BP 114/62 | HR 75 | Ht 72.0 in | Wt 183.4 lb

## 2021-08-28 DIAGNOSIS — Z951 Presence of aortocoronary bypass graft: Secondary | ICD-10-CM | POA: Insufficient documentation

## 2021-08-28 HISTORY — DX: Atherosclerotic heart disease of native coronary artery without angina pectoris: I25.10

## 2021-08-28 NOTE — Progress Notes (Signed)
Cardiac Rehab Medication Review by a Nurse ? ?Does the patient  feel that his/her medications are working for him/her?  YES ? ?Has the patient been experiencing any side effects to the medications prescribed?   NO ? ?Does the patient measure his/her own blood pressure or blood glucose at home?  YES  ? ?Does the patient have any problems obtaining medications due to transportation or finances?    NO ? ?Understanding of regimen: excellent ?Understanding of indications: good ?Potential of compliance: good ? ? ? ?Nurse comments: Cristian Cantrell is taking her medications as prescribed and has a good understanding of what his medications are for. Cristian Cantrell checks his blood pressures at home on a daily basis. ? ? ? ?Harrell Gave RN ?08/28/2021 12:02 PM ?  ?

## 2021-08-28 NOTE — Progress Notes (Signed)
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  Patient's Home Medications on Admission:  Current Outpatient Medications:    amLODipine (NORVASC) 5 MG tablet, Take 5 mg by mouth every evening., Disp: , Rfl:    aspirin EC 81 MG tablet, Take 1 tablet (81 mg total) by mouth daily., Disp: 30 tablet, Rfl: 3   cholecalciferol (VITAMIN D) 1000 UNITS tablet, Take 1,000 Units by mouth in the morning., Disp: , Rfl:    clopidogrel (PLAVIX) 75 MG tablet, Take 1 tablet (75 mg total) by mouth daily., Disp: 30 tablet, Rfl: 3   levothyroxine (SYNTHROID, LEVOTHROID) 50 MCG tablet, Take 50 mcg by mouth daily before breakfast., Disp: , Rfl:    losartan (COZAAR) 50 MG tablet, Take 50 mg by mouth daily., Disp: , Rfl:    nebivolol (BYSTOLIC) 10 MG tablet, Take 1 tablet (10 mg total) by mouth daily., Disp: 30 tablet, Rfl: 1   nitroGLYCERIN (NITROSTAT) 0.4 MG SL tablet, Place 1 tablet (0.4 mg total) under the tongue every 5 (five) minutes as needed for chest pain., Disp: 30 tablet, Rfl: 3   pantoprazole (PROTONIX) 40 MG tablet, Take 1 tablet (40 mg total) by mouth daily., Disp: 30 tablet, Rfl: 3   rosuvastatin (CRESTOR) 40 MG tablet, Take 1 tablet (40 mg total) by mouth daily. (Patient taking differently: Take 40 mg by mouth every evening.), Disp: 90 tablet, Rfl: 3   tamsulosin (FLOMAX) 0.4 MG CAPS capsule, Take 0.4 mg by mouth in the morning., Disp: , Rfl:    traMADol (ULTRAM) 50 MG tablet, Take 1 tablet (50 mg total) by mouth every 6 (six) hours as needed for moderate pain.  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   left nasal tip - MOHs   Squamous cell carcinoma, face 08/19/2017   left nostril - CX3 + 5FU   Syncope    Tinnitus of left ear     Tobacco Use: Social History   Tobacco Use  Smoking Status Never  Smokeless Tobacco Never    Labs: Recent Review Flowsheet Data     Labs for ITP Cardiac and Pulmonary Rehab Latest Ref Rng & Units 06/01/2021 06/01/2021 06/01/2021 06/01/2021 08/01/2021   Cholestrol 100 - 199 mg/dL - - - - 111   LDLCALC 0 - 99 mg/dL - - - - 57   HDL >39 mg/dL - - - - 37(L)   Trlycerides 0 - 149 mg/dL - - - - 86   Hemoglobin A1c 4.8 - 5.6 % - - - - -   PHART 7.350 - 7.450 7.445 7.444 7.305(L) 7.348(L) -   PCO2ART 32.0 - 48.0 mmHg 35.8 33.0 45.1 36.8 -  HCO3 20.0 - 28.0 mmol/L 24.6 22.8 22.5 20.1 -   TCO2 22 - 32 mmol/L '26 24 24 '$ 21(L) -   ACIDBASEDEF 0.0 - 2.0 mmol/L - 1.0 4.0(H) 5.0(H) -   O2SAT % 100.0 99.0 98.0 96.0 -       Capillary Blood Glucose: Lab Results  Component Value Date   GLUCAP 145 (H) 06/04/2021   GLUCAP 113 (H) 06/04/2021   GLUCAP 129 (H) 06/03/2021   GLUCAP 134 (H) 06/03/2021   GLUCAP 143 (H) 06/03/2021     Exercise Target Goals: Exercise Program Goal: Individual exercise prescription set using results from initial 6 min walk test and THRR while considering  patients activity barriers and safety.   Exercise Prescription Goal: Initial exercise prescription builds to 30-45 minutes a day of aerobic activity, 2-3 days per week.  Home exercise guidelines will be given to patient during program as part of exercise prescription that the participant will  acknowledge.  Activity Barriers & Risk Stratification:  Activity Barriers & Cardiac Risk Stratification - 08/28/21 1056       Activity Barriers & Cardiac Risk Stratification   Activity Barriers None    Cardiac Risk Stratification Moderate             6 Minute Walk:  6 Minute Walk     Row Name 08/28/21 1105         6 Minute Walk   Phase Initial     Distance 2141 feet     Walk Time 6 minutes     # of Rest Breaks 0     MPH 4.05     METS 4.77     RPE 13     Perceived Dyspnea  0     VO2 Peak 16.71     Symptoms No     Resting HR 75 bpm     Resting BP 114/62     Resting Oxygen Saturation  99 %     Exercise Oxygen Saturation  during 6 min walk 98 %     Max Ex. HR 111 bpm     Max Ex. BP 150/70     2 Minute Post BP 138/82              Oxygen Initial Assessment:   Oxygen Re-Evaluation:   Oxygen Discharge (Final Oxygen Re-Evaluation):   Initial Exercise Prescription:  Initial Exercise Prescription - 08/28/21 1100       Date of Initial Exercise RX and Referring Provider   Date 08/28/21    Referring Provider Nigel Mormon, MD    Expected Discharge Date 10/26/21      Treadmill   MPH 3    Grade 0    Minutes 15    METs 3.3      NuStep   Level 4    SPM 85    Minutes 15    METs 3.5      Prescription Details   Frequency (times per week) 3    Duration Progress to 30 minutes of continuous aerobic without signs/symptoms of physical distress      Intensity   THRR 40-80% of Max Heartrate 59-118    Ratings of Perceived Exertion 11-13    Perceived Dyspnea 0-4      Progression   Progression Continue to progress workloads to maintain intensity without signs/symptoms of physical distress.      Resistance Training   Training Prescription Yes    Weight 5 lbs    Reps 10-15  Perform Capillary Blood Glucose checks as needed.  Exercise Prescription Changes:   Exercise Comments:   Exercise Goals and Review:   Exercise Goals      Row Name 08/28/21 1032             Exercise Goals   Increase Physical Activity Yes       Intervention Provide advice, education, support and counseling about physical activity/exercise needs.;Develop an individualized exercise prescription for aerobic and resistive training based on initial evaluation findings, risk stratification, comorbidities and participant's personal goals.       Expected Outcomes Short Term: Attend rehab on a regular basis to increase amount of physical activity.;Long Term: Exercising regularly at least 3-5 days a week.;Long Term: Add in home exercise to make exercise part of routine and to increase amount of physical activity.       Increase Strength and Stamina Yes       Intervention Provide advice, education, support and counseling about physical activity/exercise needs.;Develop an individualized exercise prescription for aerobic and resistive training based on initial evaluation findings, risk stratification, comorbidities and participant's personal goals.       Expected Outcomes Short Term: Increase workloads from initial exercise prescription for resistance, speed, and METs.;Short Term: Perform resistance training exercises routinely during rehab and add in resistance training at home;Long Term: Improve cardiorespiratory fitness, muscular endurance and strength as measured by increased METs and functional capacity (6MWT)       Able to understand and use rate of perceived exertion (RPE) scale Yes       Intervention Provide education and explanation on how to use RPE scale       Expected Outcomes Short Term: Able to use RPE daily in rehab to express subjective intensity level;Long Term:  Able to use RPE to guide intensity level when exercising independently       Knowledge and understanding of Target Heart Rate Range (THRR) Yes       Intervention Provide education and explanation of THRR including how the numbers were predicted and where they are located for reference        Expected Outcomes Short Term: Able to state/look up THRR;Long Term: Able to use THRR to govern intensity when exercising independently;Short Term: Able to use daily as guideline for intensity in rehab       Able to check pulse independently Yes       Intervention Provide education and demonstration on how to check pulse in carotid and radial arteries.;Review the importance of being able to check your own pulse for safety during independent exercise       Expected Outcomes Short Term: Able to explain why pulse checking is important during independent exercise;Long Term: Able to check pulse independently and accurately       Understanding of Exercise Prescription Yes       Intervention Provide education, explanation, and written materials on patient's individual exercise prescription       Expected Outcomes Short Term: Able to explain program exercise prescription;Long Term: Able to explain home exercise prescription to exercise independently                Exercise Goals Re-Evaluation :   Discharge Exercise Prescription (Final Exercise Prescription Changes):   Nutrition:  Target Goals: Understanding of nutrition guidelines, daily intake of sodium '1500mg'$ , cholesterol '200mg'$ , calories 30% from fat and 7% or less from saturated fats, daily to have 5 or more servings of fruits and vegetables.  Biometrics:  Pre Biometrics - 08/28/21 1029  Pre Biometrics   Waist Circumference 35.5 inches    Hip Circumference 40.25 inches    Waist to Hip Ratio 0.88 %    Triceps Skinfold 14 mm    % Body Fat 23.9 %    Grip Strength 50 kg    Flexibility 14 in    Single Leg Stand 30 seconds              Nutrition Therapy Plan and Nutrition Goals:   Nutrition Assessments:  MEDIFICTS Score Key: ?70 Need to make dietary changes  40-70 Heart Healthy Diet ? 40 Therapeutic Level Cholesterol Diet    Picture Your Plate Scores: <16 Unhealthy dietary pattern with much room for  improvement. 41-50 Dietary pattern unlikely to meet recommendations for good health and room for improvement. 51-60 More healthful dietary pattern, with some room for improvement.  >60 Healthy dietary pattern, although there may be some specific behaviors that could be improved.    Nutrition Goals Re-Evaluation:   Nutrition Goals Re-Evaluation:   Nutrition Goals Discharge (Final Nutrition Goals Re-Evaluation):   Psychosocial: Target Goals: Acknowledge presence or absence of significant depression and/or stress, maximize coping skills, provide positive support system. Participant is able to verbalize types and ability to use techniques and skills needed for reducing stress and depression.  Initial Review & Psychosocial Screening:  Initial Psych Review & Screening - 08/28/21 1204       Initial Review   Current issues with None Identified      Family Dynamics   Good Support System? Yes   Ron has his wife and adult children for support   Comments Ron helped to care for his 35 year old mother before having open heart surgery      Screening Interventions   Interventions Encouraged to exercise             Quality of Life Scores:  Quality of Life - 08/28/21 1140       Quality of Life   Select Quality of Life      Quality of Life Scores   Health/Function Pre 26.2 %    Socioeconomic Pre 28.21 %    Psych/Spiritual Pre 28.79 %    Family Pre 30 %    GLOBAL Pre 27.71 %            Scores of 19 and below usually indicate a poorer quality of life in these areas.  A difference of  2-3 points is a clinically meaningful difference.  A difference of 2-3 points in the total score of the Quality of Life Index has been associated with significant improvement in overall quality of life, self-image, physical symptoms, and general health in studies assessing change in quality of life.  PHQ-9: Recent Review Flowsheet Data     Depression screen [facility] 2/9 08/28/2021   Decreased Interest  0   Down, Depressed, Hopeless 0   PHQ - 2 Score 0      Interpretation of Total Score  Total Score Depression Severity:  1-4 = Minimal depression, 5-9 = Mild depression, 10-14 = Moderate depression, 15-19 = Moderately severe depression, 20-27 = Severe depression   Psychosocial Evaluation and Intervention:   Psychosocial Re-Evaluation:   Psychosocial Discharge (Final Psychosocial Re-Evaluation):   Vocational Rehabilitation: Provide vocational rehab assistance to qualifying candidates.   Vocational Rehab Evaluation & Intervention:  Vocational Rehab - 08/28/21 1207       Initial Vocational Rehab Evaluation & Intervention   Assessment shows need for Vocational Rehabilitation No  Edd Arbour is retired and does not need vocational rehab at this time.            Education: Education Goals: Education classes will be provided on a weekly basis, covering required topics. Participant will state understanding/return demonstration of topics presented.  Learning Barriers/Preferences:  Learning Barriers/Preferences - 08/28/21 1206       Learning Barriers/Preferences   Learning Barriers Sight;Hearing   Ronnie wears glasses and has hearing aides   Learning Preferences Pictoral;Video             Education Topics: Count Your Pulse:  -Group instruction provided by verbal instruction, demonstration, patient participation and written materials to support subject.  Instructors address importance of being able to find your pulse and how to count your pulse when at home without a heart monitor.  Patients get hands on experience counting their pulse with staff help and individually.   Heart Attack, Angina, and Risk Factor Modification:  -Group instruction provided by verbal instruction, video, and written materials to support subject.  Instructors address signs and symptoms of angina and heart attacks.    Also discuss risk factors for heart disease and how to make changes to improve heart  health risk factors.   Functional Fitness:  -Group instruction provided by verbal instruction, demonstration, patient participation, and written materials to support subject.  Instructors address safety measures for doing things around the house.  Discuss how to get up and down off the floor, how to pick things up properly, how to safely get out of a chair without assistance, and balance training.   Meditation and Mindfulness:  -Group instruction provided by verbal instruction, patient participation, and written materials to support subject.  Instructor addresses importance of mindfulness and meditation practice to help reduce stress and improve awareness.  Instructor also leads participants through a meditation exercise.    Stretching for Flexibility and Mobility:  -Group instruction provided by verbal instruction, patient participation, and written materials to support subject.  Instructors lead participants through series of stretches that are designed to increase flexibility thus improving mobility.  These stretches are additional exercise for major muscle groups that are typically performed during regular warm up and cool down.   Hands Only CPR:  -Group verbal, video, and participation provides a basic overview of AHA guidelines for community CPR. Role-play of emergencies allow participants the opportunity to practice calling for help and chest compression technique with discussion of AED use.   Hypertension: -Group verbal and written instruction that provides a basic overview of hypertension including the most recent diagnostic guidelines, risk factor reduction with self-care instructions and medication management.    Nutrition I class: Heart Healthy Eating:  -Group instruction provided by PowerPoint slides, verbal discussion, and written materials to support subject matter. The instructor gives an explanation and review of the Therapeutic Lifestyle Changes diet recommendations, which  includes a discussion on lipid goals, dietary fat, sodium, fiber, plant stanol/sterol esters, sugar, and the components of a well-balanced, healthy diet.   Nutrition II class: Lifestyle Skills:  -Group instruction provided by PowerPoint slides, verbal discussion, and written materials to support subject matter. The instructor gives an explanation and review of label reading, grocery shopping for heart health, heart healthy recipe modifications, and ways to make healthier choices when eating out.   Diabetes Question & Answer:  -Group instruction provided by PowerPoint slides, verbal discussion, and written materials to support subject matter. The instructor gives an explanation and review of diabetes co-morbidities, pre- and post-prandial blood glucose goals,  pre-exercise blood glucose goals, signs, symptoms, and treatment of hypoglycemia and hyperglycemia, and foot care basics.   Diabetes Blitz:  -Group instruction provided by PowerPoint slides, verbal discussion, and written materials to support subject matter. The instructor gives an explanation and review of the physiology behind type 1 and type 2 diabetes, diabetes medications and rational behind using different medications, pre- and post-prandial blood glucose recommendations and Hemoglobin A1c goals, diabetes diet, and exercise including blood glucose guidelines for exercising safely.    Portion Distortion:  -Group instruction provided by PowerPoint slides, verbal discussion, written materials, and food models to support subject matter. The instructor gives an explanation of serving size versus portion size, changes in portions sizes over the last 20 years, and what consists of a serving from each food group.   Stress Management:  -Group instruction provided by verbal instruction, video, and written materials to support subject matter.  Instructors review role of stress in heart disease and how to cope with stress positively.      Exercising on Your Own:  -Group instruction provided by verbal instruction, power point, and written materials to support subject.  Instructors discuss benefits of exercise, components of exercise, frequency and intensity of exercise, and end points for exercise.  Also discuss use of nitroglycerin and activating EMS.  Review options of places to exercise outside of rehab.  Review guidelines for sex with heart disease.   Cardiac Drugs I:  -Group instruction provided by verbal instruction and written materials to support subject.  Instructor reviews cardiac drug classes: antiplatelets, anticoagulants, beta blockers, and statins.  Instructor discusses reasons, side effects, and lifestyle considerations for each drug class.   Cardiac Drugs II:  -Group instruction provided by verbal instruction and written materials to support subject.  Instructor reviews cardiac drug classes: angiotensin converting enzyme inhibitors (ACE-I), angiotensin II receptor blockers (ARBs), nitrates, and calcium channel blockers.  Instructor discusses reasons, side effects, and lifestyle considerations for each drug class.   Anatomy and Physiology of the Circulatory System:  Group verbal and written instruction and models provide basic cardiac anatomy and physiology, with the coronary electrical and arterial systems. Review of: AMI, Angina, Valve disease, Heart Failure, Peripheral Artery Disease, Cardiac Arrhythmia, Pacemakers, and the ICD.   Other Education:  -Group or individual verbal, written, or video instructions that support the educational goals of the cardiac rehab program.   Holiday Eating Survival Tips:  -Group instruction provided by PowerPoint slides, verbal discussion, and written materials to support subject matter. The instructor gives patients tips, tricks, and techniques to help them not only survive but enjoy the holidays despite the onslaught of food that accompanies the holidays.   Knowledge  Questionnaire Score:  Knowledge Questionnaire Score - 08/28/21 1141       Knowledge Questionnaire Score   Pre Score 22/24             Core Components/Risk Factors/Patient Goals at Admission:  Personal Goals and Risk Factors at Admission - 08/28/21 1031       Core Components/Risk Factors/Patient Goals on Admission   Hypertension Yes    Intervention Provide education on lifestyle modifcations including regular physical activity/exercise, weight management, moderate sodium restriction and increased consumption of fresh fruit, vegetables, and low fat dairy, alcohol moderation, and smoking cessation.;Monitor prescription use compliance.    Expected Outcomes Short Term: Continued assessment and intervention until BP is < 140/33m HG in hypertensive participants. < 130/844mHG in hypertensive participants with diabetes, heart failure or chronic kidney disease.;Long Term: Maintenance of  blood pressure at goal levels.    Lipids Yes    Intervention Provide education and support for participant on nutrition & aerobic/resistive exercise along with prescribed medications to achieve LDL '70mg'$ , HDL >'40mg'$ .    Expected Outcomes Short Term: Participant states understanding of desired cholesterol values and is compliant with medications prescribed. Participant is following exercise prescription and nutrition guidelines.;Long Term: Cholesterol controlled with medications as prescribed, with individualized exercise RX and with personalized nutrition plan. Value goals: LDL < '70mg'$ , HDL > 40 mg.             Core Components/Risk Factors/Patient Goals Review:    Core Components/Risk Factors/Patient Goals at Discharge (Final Review):    ITP Comments:  ITP Comments     Row Name 08/28/21 1030           ITP Comments Medical Director- Dr. Fransico Him, MD                Comments: Patient attended orientation for the cardiac rehabilitation program on 08/28/2021 to review rules and guidelines for  the program. Completed 6-minute walk test, Initial ITP, and exercise prescription. Vital signs stable. Telemetry- Normal sinus rhythm with occasional PVCs, asymptomatic. Safety measures and social distancing in place per CDC guidelines.

## 2021-09-03 ENCOUNTER — Encounter (HOSPITAL_COMMUNITY)
Admission: RE | Admit: 2021-09-03 | Discharge: 2021-09-03 | Disposition: A | Discharge status: Home / Self Care | Payer: Medicare HMO | Source: Ambulatory Visit | Attending: Cardiology | Admitting: Cardiology

## 2021-09-03 ENCOUNTER — Other Ambulatory Visit: Payer: Self-pay

## 2021-09-03 DIAGNOSIS — Z951 Presence of aortocoronary bypass graft: Secondary | ICD-10-CM | POA: Diagnosis not present

## 2021-09-03 NOTE — Progress Notes (Signed)
Daily Session Note ? ?Patient Details  ?Name: Cristian Cantrell ?MRN: [id number] ?Date of Birth: 01-12-1951 ?Referring Provider:   ?Flowsheet Row CARDIAC REHAB PHASE II ORIENTATION from 08/28/2021 in [place]  ?Referring Provider Patwardhan, Reynold Bowen, MD  ? ?  ? ? ?Encounter Date: 09/03/2021 ? ?Check In: ? Session Check In - 09/03/21 1048   ? ?  ? Check-In  ? Supervising physician immediately available to respond to emergencies [facility] immediately available   ? Physician(s) Dr. Alfredia Ferguson   ? Location [facility]   ? Staff Present Esmeralda Links BS, ACSM EP-C, Exercise Physiologist;Shamiya Demeritt, RN, Environmental consultant, MS, ACSM CEP, Exercise Physiologist;David Intel, MS, ACSM-CEP, CCRP, Exercise Physiologist;Carlette Wilber Oliphant, Therapist, sports, BSN   ? Virtual Visit No   ? Medication changes reported     No   ? Fall or balance concerns reported    No   ? Tobacco Cessation No Change   ? Warm-up and Cool-down Performed as group-led instruction   ? Resistance Training Performed Yes   ? VAD Patient? No   ? PAD/SET Patient? No   ?  ? Pain Assessment  ? Currently in Pain? No/denies   ? Pain Score 0-No pain   ? Multiple Pain Sites No   ? ?  ?  ? ?  ? ? ?Capillary Blood Glucose: ?No results found for this or any previous visit (from the past 24 hour(s)). ? ? Exercise Prescription Changes - 09/03/21 1028   ? ?  ? Response to Exercise  ? Blood Pressure (Admit) 120/72   ? Blood Pressure (Exercise) 138/78   ? Blood Pressure (Exit) 100/60   ? Heart Rate (Admit) 64 bpm   ? Heart Rate (Exercise) 100 bpm   ? Heart Rate (Exit) 72 bpm   ? Rating of Perceived Exertion (Exercise) 13   ? Symptoms None   ? Comments Off to a great start with exercise.   ? Duration Continue with 30 min of aerobic exercise without signs/symptoms of physical distress.   ? Intensity THRR unchanged   ?  ? Progression  ? Progression Continue to progress workloads to maintain intensity without signs/symptoms of physical  distress.   ? Average METs 3.5   ?  ? Resistance Training  ? Training Prescription Yes   ? Weight 5 lbs   ? Reps 10-15   ? Time 10 Minutes   ?  ? Interval Training  ? Interval Training No   ?  ? Treadmill  ? MPH 3   ? Grade 0   ? Minutes 15   ? METs 3.3   ?  ? NuStep  ? Level 4   ? SPM 97   ? Minutes 15   ? METs 3.7   ? ?  ?  ? ?  ? ? ?Social History  ? ?Tobacco Use  ?Smoking Status Never  ?Smokeless Tobacco Never  ? ? ?Goals Met:  ?Exercise tolerated well ?No report of concerns or symptoms today ?Strength training completed today ? ?Goals Unmet:  ?Not Applicable ? ?Comments: Pt started cardiac rehab today.  Pt tolerated light exercise without difficulty. VSS, telemetry-Sinus Rhythm, asymptomatic.  Medication list reconciled. Pt denies barriers to medicaiton compliance.  PSYCHOSOCIAL ASSESSMENT:  PHQ-0. Pt exhibits positive coping skills, hopeful outlook with supportive family. No psychosocial needs identified at this time, no psychosocial interventions necessary.   Pt oriented to exercise equipment and routine.    Understanding verbalized. Harrell Gave RN  BSN  ? ? ?Dr. Fransico Him is Medical Director for Cardiac Rehab at [facility]. ?

## 2021-09-04 NOTE — Progress Notes (Signed)
Cardiac Individual Treatment Plan ? ?Patient Details  ?Name: Cristian Cantrell ?MRN: [id number] ?Date of Birth: 1950-11-30 ?Referring Provider:   ?Flowsheet Row CARDIAC REHAB PHASE II ORIENTATION from 08/28/2021 in [place]  ?Referring Provider Patwardhan, Reynold Bowen, MD  ? ?  ? ? ?Initial Encounter Date:  ?Flowsheet Row CARDIAC REHAB PHASE II ORIENTATION from 08/28/2021 in [place]  ?Date 08/28/21  ? ?  ? ? 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 ?Patient's Home Medications on Admission: ? ?Current Outpatient Medications:  ?  amLODipine (NORVASC) 5 MG tablet, Take 5 mg by mouth every evening., Disp: , Rfl:  ?  aspirin EC 81 MG tablet, Take 1 tablet (81 mg total) by mouth daily., Disp: 30 tablet, Rfl: 3 ?  cholecalciferol (VITAMIN D) 1000 UNITS tablet, Take 1,000 Units by mouth in the morning., Disp: , Rfl:  ?  clopidogrel (PLAVIX) 75 MG tablet, Take 1 tablet (75 mg total) by mouth daily., Disp: 30 tablet, Rfl: 3 ?  levothyroxine (SYNTHROID, LEVOTHROID) 50 MCG tablet, Take 50 mcg by mouth daily before breakfast., Disp: , Rfl:  ?  losartan (COZAAR) 50 MG tablet, Take 50 mg by mouth daily., Disp: , Rfl:  ?  nebivolol (BYSTOLIC) 10 MG tablet, Take 1 tablet (10 mg total) by mouth daily., Disp: 30 tablet, Rfl: 1 ?  nitroGLYCERIN (NITROSTAT) 0.4 MG SL tablet, Place 1 tablet (0.4 mg total) under the tongue every 5 (five) minutes as needed for chest pain., Disp: 30 tablet, Rfl: 3 ?  pantoprazole (PROTONIX) 40 MG tablet, Take 1 tablet (40 mg total) by mouth daily., Disp: 30 tablet, Rfl: 3 ?  rosuvastatin (CRESTOR) 40 MG tablet, Take 1 tablet (40 mg total) by mouth daily. (Patient taking differently: Take 40 mg by mouth every evening.), Disp: 90 tablet, Rfl: 3 ?  tamsulosin (FLOMAX) 0.4 MG CAPS capsule, Take 0.4 mg by mouth in the morning., Disp: , Rfl:  ?  traMADol (ULTRAM) 50 MG tablet, Take 1 tablet (50 mg total) by mouth every 6 (six) hours as needed for moderate pain.  (Patient not taking: Reported on 08/23/2021), Disp: 30 tablet, Rfl: 0 ? ?Past Medical History: ?Past Medical History:  ?Diagnosis Date  ? Anginal pain ([place])   ? Chronic kidney disease   ? Coronary artery disease   ? GERD (gastroesophageal reflux disease)   ? HLD (hyperlipidemia)   ? HTN (hypertension)   ? Hypothyroidism   ? SOB (shortness of breath)   ? 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 left nasal tip - MOHs  ? Squamous cell carcinoma, face 08/19/2017  ? left nostril - CX3 + 5FU  ? Syncope   ? Tinnitus of left ear   ? ? ?Tobacco Use: ?Social History  ? ?Tobacco Use  ?Smoking Status Never  ?Smokeless Tobacco Never  ? ? ?Labs: ?Recent Review Flowsheet Data   ? ? Labs for ITP Cardiac and Pulmonary Rehab Latest Ref Rng & Units 06/01/2021 06/01/2021 06/01/2021 06/01/2021 08/01/2021  ? Cholestrol 100 - 199 mg/dL - - - - 111  ? LDLCALC 0 - 99 mg/dL - - - - 57  ? HDL >39 mg/dL - - - - 37(L)  ? Trlycerides 0 - 149 mg/dL - - - - 86  ? Hemoglobin A1c 4.8 - 5.6 % - - - - -  ? PHART 7.350 - 7.450 7.445 7.444 7.305(L) 7.348(L) -  ? PCO2ART 32.0 - 48.0 mmHg 35.8 33.0 45.1 36.8 -  ?  HCO3 20.0 - 28.0 mmol/L 24.6 22.8 22.5 20.1 -  ? TCO2 22 - 32 mmol/L '26 24 24 '$ 21(L) -  ? ACIDBASEDEF 0.0 - 2.0 mmol/L - 1.0 4.0(H) 5.0(H) -  ? O2SAT % 100.0 99.0 98.0 96.0 -  ? ?  ? ? ?Capillary Blood Glucose: ?Lab Results  ?Component Value Date  ? GLUCAP 145 (H) 06/04/2021  ? GLUCAP 113 (H) 06/04/2021  ? GLUCAP 129 (H) 06/03/2021  ? GLUCAP 134 (H) 06/03/2021  ? GLUCAP 143 (H) 06/03/2021  ? ? ? ?Exercise Target Goals: ?Exercise Program Goal: ?Individual exercise prescription set using results from initial 6 min walk test and THRR while considering  patient?s activity barriers and safety.  ? ?Exercise Prescription Goal: ?Initial exercise prescription builds to 30-45 minutes a day of aerobic activity, 2-3 days per week.  Home exercise guidelines will be given to patient during program as part of exercise prescription that the participant will  acknowledge. ? ?Activity Barriers & Risk Stratification: ? Activity Barriers & Cardiac Risk Stratification - 08/28/21 1056   ? ?  ? Activity Barriers & Cardiac Risk Stratification  ? Activity Barriers None   ? Cardiac Risk Stratification Moderate   ? ?  ?  ? ?  ? ? ?6 Minute Walk: ? 6 Minute Walk   ? ? [place] Name 08/28/21 1105  ?  ?  ?  ? 6 Minute Walk  ? Phase Initial    ? Distance 2141 feet    ? Walk Time 6 minutes    ? # of Rest Breaks 0    ? MPH 4.05    ? METS 4.77    ? RPE 13    ? Perceived Dyspnea  0    ? VO2 Peak 16.71    ? Symptoms No    ? Resting HR 75 bpm    ? Resting BP 114/62    ? Resting Oxygen Saturation  99 %    ? Exercise Oxygen Saturation  during 6 min walk 98 %    ? Max Ex. HR 111 bpm    ? Max Ex. BP 150/70    ? 2 Minute Post BP 138/82    ? ?  ?  ? ?  ? ? ?Oxygen Initial Assessment: ? ? ?Oxygen Re-Evaluation: ? ? ?Oxygen Discharge (Final Oxygen Re-Evaluation): ? ? ?Initial Exercise Prescription: ? Initial Exercise Prescription - 08/28/21 1100   ? ?  ? Date of Initial Exercise RX and Referring Provider  ? Date 08/28/21   ? Referring Provider Nigel Mormon, MD   ? Expected Discharge Date 10/26/21   ?  ? Treadmill  ? MPH 3   ? Grade 0   ? Minutes 15   ? METs 3.3   ?  ? NuStep  ? Level 4   ? SPM 85   ? Minutes 15   ? METs 3.5   ?  ? Prescription Details  ? Frequency (times per week) 3   ? Duration Progress to 30 minutes of continuous aerobic without signs/symptoms of physical distress   ?  ? Intensity  ? THRR 40-80% of Max Heartrate 59-118   ? Ratings of Perceived Exertion 11-13   ? Perceived Dyspnea 0-4   ?  ? Progression  ? Progression Continue to progress workloads to maintain intensity without signs/symptoms of physical distress.   ?  ? Resistance Training  ? Training Prescription Yes   ? Weight 5 lbs   ? Reps 10-15   ? ?  ?  ? ?  ? ? ?  Perform Capillary Blood Glucose checks as needed. ? ?Exercise Prescription Changes: ? ? Exercise Prescription Changes   ? ? [place] Name 09/03/21 1028  ?  ?  ?  ?   ?  ? Response to Exercise  ? Blood Pressure (Admit) 120/72      ? Blood Pressure (Exercise) 138/78      ? Blood Pressure (Exit) 100/60      ? Heart Rate (Admit) 64 bpm      ? Heart Rate (Exercise) 100 bpm      ? Heart Rate (Exit) 72 bpm      ? Rating of Perceived Exertion (Exercise) 13      ? Symptoms None      ? Comments Off to a great start with exercise.      ? Duration Continue with 30 min of aerobic exercise without signs/symptoms of physical distress.      ? Intensity THRR unchanged      ?  ? Progression  ? Progression Continue to progress workloads to maintain intensity without signs/symptoms of physical distress.      ? Average METs 3.5      ?  ? Resistance Training  ? Training Prescription Yes      ? Weight 5 lbs      ? Reps 10-15      ? Time 10 Minutes      ?  ? Interval Training  ? Interval Training No      ?  ? Treadmill  ? MPH 3      ? Grade 0      ? Minutes 15      ? METs 3.3      ?  ? NuStep  ? Level 4      ? SPM 97      ? Minutes 15      ? METs 3.7      ? ?  ?  ? ?  ? ? ?Exercise Comments: ? ? Exercise Comments   ? ? [place] Name 09/03/21 1128  ?  ?  ?  ?  ? Exercise Comments Patient tolerated 1st session of exercise well without symptoms. Reviewed METs with patient.      ? ?  ?  ? ?  ? ? ?Exercise Goals and Review: ? ? Exercise Goals   ? ? [place] Name 08/28/21 1032  ?  ?  ?  ?  ?  ? Exercise Goals  ? Increase Physical Activity Yes      ? Intervention Provide advice, education, support and counseling about physical activity/exercise needs.;Develop an individualized exercise prescription for aerobic and resistive training based on initial evaluation findings, risk stratification, comorbidities and participant's personal goals.      ? Expected Outcomes Short Term: Attend rehab on a regular basis to increase amount of physical activity.;Long Term: Exercising regularly at least 3-5 days a week.;Long Term: Add in home exercise to make exercise part of routine and to increase amount of physical activity.      ?  Increase Strength and Stamina Yes      ? Intervention Provide advice, education, support and counseling about physical activity/exercise needs.;Develop an individualized exercise prescription for aerobic and re

## 2021-09-05 ENCOUNTER — Encounter (HOSPITAL_COMMUNITY)
Admission: RE | Admit: 2021-09-05 | Discharge: 2021-09-05 | Disposition: A | Discharge status: Home / Self Care | Payer: Medicare HMO | Source: Ambulatory Visit | Attending: Cardiology | Admitting: Cardiology

## 2021-09-05 ENCOUNTER — Other Ambulatory Visit: Payer: Self-pay

## 2021-09-05 DIAGNOSIS — Z951 Presence of aortocoronary bypass graft: Secondary | ICD-10-CM | POA: Diagnosis not present

## 2021-09-07 ENCOUNTER — Other Ambulatory Visit: Payer: Self-pay

## 2021-09-07 ENCOUNTER — Encounter (HOSPITAL_COMMUNITY)
Admission: RE | Admit: 2021-09-07 | Discharge: 2021-09-07 | Disposition: A | Discharge status: Home / Self Care | Payer: Medicare HMO | Source: Ambulatory Visit | Attending: Cardiology | Admitting: Cardiology

## 2021-09-07 DIAGNOSIS — Z951 Presence of aortocoronary bypass graft: Secondary | ICD-10-CM

## 2021-09-10 ENCOUNTER — Other Ambulatory Visit: Payer: Self-pay

## 2021-09-10 ENCOUNTER — Encounter (HOSPITAL_COMMUNITY)
Admission: RE | Admit: 2021-09-10 | Discharge: 2021-09-10 | Disposition: A | Discharge status: Home / Self Care | Payer: Medicare HMO | Source: Ambulatory Visit | Attending: Cardiology | Admitting: Cardiology

## 2021-09-10 DIAGNOSIS — Z951 Presence of aortocoronary bypass graft: Secondary | ICD-10-CM | POA: Diagnosis not present

## 2021-09-10 NOTE — Progress Notes (Signed)
Reviewed home exercise guidelines with patient including endpoints, temperature precautions, target heart rate and rate of perceived exertion. Patient is currently walking at least 30 minutes 5 days/week as his mode of home exercise. Patient voices understanding of instructions given. ? ?Cristian Passer, MS, ACSM CEP ? ?

## 2021-09-11 DIAGNOSIS — H5213 Myopia, bilateral: Secondary | ICD-10-CM | POA: Diagnosis not present

## 2021-09-11 DIAGNOSIS — H43813 Vitreous degeneration, bilateral: Secondary | ICD-10-CM | POA: Diagnosis not present

## 2021-09-11 DIAGNOSIS — D23112 Other benign neoplasm of skin of right lower eyelid, including canthus: Secondary | ICD-10-CM | POA: Diagnosis not present

## 2021-09-11 DIAGNOSIS — H2513 Age-related nuclear cataract, bilateral: Secondary | ICD-10-CM | POA: Diagnosis not present

## 2021-09-12 ENCOUNTER — Other Ambulatory Visit: Payer: Self-pay

## 2021-09-12 ENCOUNTER — Encounter (HOSPITAL_COMMUNITY)
Admission: RE | Admit: 2021-09-12 | Discharge: 2021-09-12 | Disposition: A | Discharge status: Home / Self Care | Payer: Medicare HMO | Source: Ambulatory Visit | Attending: Cardiology | Admitting: Cardiology

## 2021-09-12 DIAGNOSIS — Z951 Presence of aortocoronary bypass graft: Secondary | ICD-10-CM

## 2021-09-14 ENCOUNTER — Other Ambulatory Visit: Payer: Self-pay

## 2021-09-14 ENCOUNTER — Encounter (HOSPITAL_COMMUNITY)
Admission: RE | Admit: 2021-09-14 | Discharge: 2021-09-14 | Disposition: A | Discharge status: Home / Self Care | Payer: Medicare HMO | Source: Ambulatory Visit | Attending: Cardiology | Admitting: Cardiology

## 2021-09-14 DIAGNOSIS — Z951 Presence of aortocoronary bypass graft: Secondary | ICD-10-CM

## 2021-09-17 ENCOUNTER — Encounter (HOSPITAL_COMMUNITY)
Admission: RE | Admit: 2021-09-17 | Discharge: 2021-09-17 | Disposition: A | Discharge status: Home / Self Care | Payer: Medicare HMO | Source: Ambulatory Visit | Attending: Cardiology | Admitting: Cardiology

## 2021-09-17 ENCOUNTER — Other Ambulatory Visit: Payer: Self-pay

## 2021-09-17 DIAGNOSIS — Z951 Presence of aortocoronary bypass graft: Secondary | ICD-10-CM

## 2021-09-19 ENCOUNTER — Other Ambulatory Visit: Payer: Self-pay

## 2021-09-19 ENCOUNTER — Encounter (HOSPITAL_COMMUNITY)
Admission: RE | Admit: 2021-09-19 | Discharge: 2021-09-19 | Disposition: A | Discharge status: Home / Self Care | Payer: Medicare HMO | Source: Ambulatory Visit | Attending: Cardiology | Admitting: Cardiology

## 2021-09-19 DIAGNOSIS — Z951 Presence of aortocoronary bypass graft: Secondary | ICD-10-CM | POA: Diagnosis not present

## 2021-09-20 DIAGNOSIS — Z85828 Personal history of other malignant neoplasm of skin: Secondary | ICD-10-CM | POA: Diagnosis not present

## 2021-09-20 DIAGNOSIS — C44319 Basal cell carcinoma of skin of other parts of face: Secondary | ICD-10-CM | POA: Diagnosis not present

## 2021-09-21 ENCOUNTER — Encounter (HOSPITAL_COMMUNITY)
Admission: RE | Admit: 2021-09-21 | Discharge: 2021-09-21 | Disposition: A | Discharge status: Home / Self Care | Payer: Medicare HMO | Source: Ambulatory Visit | Attending: Cardiology | Admitting: Cardiology

## 2021-09-21 DIAGNOSIS — Z951 Presence of aortocoronary bypass graft: Secondary | ICD-10-CM

## 2021-09-24 ENCOUNTER — Encounter (HOSPITAL_COMMUNITY)
Admission: RE | Admit: 2021-09-24 | Discharge: 2021-09-24 | Disposition: A | Discharge status: Home / Self Care | Payer: Medicare HMO | Source: Ambulatory Visit | Attending: Cardiology | Admitting: Cardiology

## 2021-09-24 DIAGNOSIS — Z48812 Encounter for surgical aftercare following surgery on the circulatory system: Secondary | ICD-10-CM | POA: Diagnosis not present

## 2021-09-24 DIAGNOSIS — Z951 Presence of aortocoronary bypass graft: Secondary | ICD-10-CM | POA: Diagnosis not present

## 2021-09-26 ENCOUNTER — Encounter (HOSPITAL_COMMUNITY)
Admission: RE | Admit: 2021-09-26 | Discharge: 2021-09-26 | Disposition: A | Discharge status: Home / Self Care | Payer: Medicare HMO | Source: Ambulatory Visit | Attending: Cardiology | Admitting: Cardiology

## 2021-09-26 DIAGNOSIS — Z951 Presence of aortocoronary bypass graft: Secondary | ICD-10-CM | POA: Diagnosis not present

## 2021-09-26 DIAGNOSIS — Z48812 Encounter for surgical aftercare following surgery on the circulatory system: Secondary | ICD-10-CM | POA: Diagnosis not present

## 2021-09-28 ENCOUNTER — Encounter (HOSPITAL_COMMUNITY)
Admission: RE | Admit: 2021-09-28 | Discharge: 2021-09-28 | Disposition: A | Discharge status: Home / Self Care | Payer: Medicare HMO | Source: Ambulatory Visit | Attending: Cardiology | Admitting: Cardiology

## 2021-09-28 DIAGNOSIS — Z951 Presence of aortocoronary bypass graft: Secondary | ICD-10-CM | POA: Diagnosis not present

## 2021-09-28 DIAGNOSIS — Z48812 Encounter for surgical aftercare following surgery on the circulatory system: Secondary | ICD-10-CM | POA: Diagnosis not present

## 2021-10-01 ENCOUNTER — Encounter (HOSPITAL_COMMUNITY)
Admission: RE | Admit: 2021-10-01 | Discharge: 2021-10-01 | Disposition: A | Discharge status: Home / Self Care | Payer: Medicare HMO | Source: Ambulatory Visit | Attending: Cardiology | Admitting: Cardiology

## 2021-10-01 DIAGNOSIS — Z951 Presence of aortocoronary bypass graft: Secondary | ICD-10-CM

## 2021-10-01 DIAGNOSIS — Z48812 Encounter for surgical aftercare following surgery on the circulatory system: Secondary | ICD-10-CM | POA: Diagnosis not present

## 2021-10-02 NOTE — Progress Notes (Signed)
Cardiac Individual Treatment Plan ? ?Patient Details  ?Name: Cristian Cantrell ?MRN: [id number] ?Date of Birth: 02-Feb-1951 ?Referring Provider:   ?Flowsheet Row CARDIAC REHAB PHASE II ORIENTATION from 08/28/2021 in [place]  ?Referring Provider Patwardhan, Reynold Bowen, MD  ? ?  ? ? ?Initial Encounter Date:  ?Flowsheet Row CARDIAC REHAB PHASE II ORIENTATION from 08/28/2021 in [place]  ?Date 08/28/21  ? ?  ? ? 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 ?Patient's Home Medications on Admission: ? ?Current Outpatient Medications:  ?  amLODipine (NORVASC) 5 MG tablet, Take 5 mg by mouth every evening., Disp: , Rfl:  ?  aspirin EC 81 MG tablet, Take 1 tablet (81 mg total) by mouth daily., Disp: 30 tablet, Rfl: 3 ?  cholecalciferol (VITAMIN D) 1000 UNITS tablet, Take 1,000 Units by mouth in the morning., Disp: , Rfl:  ?  clopidogrel (PLAVIX) 75 MG tablet, Take 1 tablet (75 mg total) by mouth daily., Disp: 30 tablet, Rfl: 3 ?  levothyroxine (SYNTHROID, LEVOTHROID) 50 MCG tablet, Take 50 mcg by mouth daily before breakfast., Disp: , Rfl:  ?  losartan (COZAAR) 50 MG tablet, Take 50 mg by mouth daily., Disp: , Rfl:  ?  nebivolol (BYSTOLIC) 10 MG tablet, Take 1 tablet (10 mg total) by mouth daily., Disp: 30 tablet, Rfl: 1 ?  nitroGLYCERIN (NITROSTAT) 0.4 MG SL tablet, Place 1 tablet (0.4 mg total) under the tongue every 5 (five) minutes as needed for chest pain., Disp: 30 tablet, Rfl: 3 ?  pantoprazole (PROTONIX) 40 MG tablet, Take 1 tablet (40 mg total) by mouth daily., Disp: 30 tablet, Rfl: 3 ?  rosuvastatin (CRESTOR) 40 MG tablet, Take 1 tablet (40 mg total) by mouth daily. (Patient taking differently: Take 40 mg by mouth every evening.), Disp: 90 tablet, Rfl: 3 ?  tamsulosin (FLOMAX) 0.4 MG CAPS capsule, Take 0.4 mg by mouth in the morning., Disp: , Rfl:  ?  traMADol (ULTRAM) 50 MG tablet, Take 1 tablet (50 mg total) by mouth every 6 (six) hours as needed for moderate pain.  (Patient not taking: Reported on 08/23/2021), Disp: 30 tablet, Rfl: 0 ? ?Past Medical History: ?Past Medical History:  ?Diagnosis Date  ? Anginal pain ([place])   ? Chronic kidney disease   ? Coronary artery disease   ? GERD (gastroesophageal reflux disease)   ? HLD (hyperlipidemia)   ? HTN (hypertension)   ? Hypothyroidism   ? SOB (shortness of breath)   ? 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 left nasal tip - MOHs  ? Squamous cell carcinoma, face 08/19/2017  ? left nostril - CX3 + 5FU  ? Syncope   ? Tinnitus of left ear   ? ? ?Tobacco Use: ?Social History  ? ?Tobacco Use  ?Smoking Status Never  ?Smokeless Tobacco Never  ? ? ?Labs: ?Review Flowsheet   ? ?  ?  Latest Ref Rng & Units 05/08/2021 05/30/2021 06/01/2021 08/01/2021  ?Labs for ITP Cardiac and Pulmonary Rehab  ?Cholestrol 100 - 199 mg/dL 125     111    ?LDL (calc) 0 - 99 mg/dL 73     57    ?HDL-C >39 mg/dL 37     37    ?Trlycerides 0 - 149 mg/dL 74     86    ?Hemoglobin A1c 4.8 - 5.6 %  5.6      ?PH, Arterial 7.350 - 7.450  7.385   7.348    ?  7.305    ? 7.444    ? 7.445    ? 7.413    ? 7.457    ? 7.485    ? 7.471    ? 7.330     ?PCO2 arterial 32.0 - 48.0 mmHg  40.3   36.8    ? 45.1    ? 33.0    ? 35.8    ? 41.6    ? 35.6    ? 33.7    ? 37.5    ? 53.0     ?Bicarbonate 20.0 - 28.0 mmol/L  23.6   20.1    ? 22.5    ? 22.8    ? 24.6    ? 26.6    ? 25.2    ? 25.4    ? 25.2    ? 27.3    ? 27.9     ?TCO2 22 - 32 mmol/L   21    ? 24    ? 24    ? 26    ? 25    ? 28    ? 26    ? 26    ? 26    ? 26    ? 28    ? 26    ? 29    ? 29     ?Acid-base deficit 0.0 - 2.0 mmol/L  0.7   5.0    ? 4.0    ? 1.0     ?O2 Saturation %  97.0   96.0    ? 98.0    ? 99.0    ? 100.0    ? 100.0    ? 100.0    ? 100.0    ? 92.0    ? 100.0    ? 100.0     ?  ? ? Multiple values from one day are sorted in reverse-chronological order  ?  ?  ? ? ?Capillary Blood Glucose: ?Lab Results  ?Component Value Date  ? GLUCAP 145 (H) 06/04/2021  ? GLUCAP 113 (H) 06/04/2021  ? GLUCAP 129 (H) 06/03/2021  ? GLUCAP  134 (H) 06/03/2021  ? GLUCAP 143 (H) 06/03/2021  ? ? ? ?Exercise Target Goals: ?Exercise Program Goal: ?Individual exercise prescription set using results from initial 6 min walk test and THRR while considering  patient?s activity barriers and safety.  ? ?Exercise Prescription Goal: ?Initial exercise prescription builds to 30-45 minutes a day of aerobic activity, 2-3 days per week.  Home exercise guidelines will be given to patient during program as part of exercise prescription that the participant will acknowledge. ? ?Activity Barriers & Risk Stratification: ? Activity Barriers & Cardiac Risk Stratification - 08/28/21 1056   ? ?  ? Activity Barriers & Cardiac Risk Stratification  ? Activity Barriers None   ? Cardiac Risk Stratification Moderate   ? ?  ?  ? ?  ? ? ?6 Minute Walk: ? 6 Minute Walk   ? ? [place] Name 08/28/21 1105  ?  ?  ?  ? 6 Minute Walk  ? Phase Initial    ? Distance 2141 feet    ? Walk Time 6 minutes    ? # of Rest Breaks 0    ? MPH 4.05    ? METS 4.77    ? RPE 13    ? Perceived Dyspnea  0    ? VO2 Peak 16.71    ? Symptoms No    ? Resting HR 75  bpm    ? Resting BP 114/62    ? Resting Oxygen Saturation  99 %    ? Exercise Oxygen Saturation  during 6 min walk 98 %    ? Max Ex. HR 111 bpm    ? Max Ex. BP 150/70    ? 2 Minute Post BP 138/82    ? ?  ?  ? ?  ? ? ?Oxygen Initial Assessment: ? ? ?Oxygen Re-Evaluation: ? ? ?Oxygen Discharge (Final Oxygen Re-Evaluation): ? ? ?Initial Exercise Prescription: ? Initial Exercise Prescription - 08/28/21 1100   ? ?  ? Date of Initial Exercise RX and Referring Provider  ? Date 08/28/21   ? Referring Provider Nigel Mormon, MD   ? Expected Discharge Date 10/26/21   ?  ? Treadmill  ? MPH 3   ? Grade 0   ? Minutes 15   ? METs 3.3   ?  ? NuStep  ? Level 4   ? SPM 85   ? Minutes 15   ? METs 3.5   ?  ? Prescription Details  ? Frequency (times per week) 3   ? Duration Progress to 30 minutes of continuous aerobic without signs/symptoms of physical distress   ?  ?  Intensity  ? THRR 40-80% of Max Heartrate 59-118   ? Ratings of Perceived Exertion 11-13   ? Perceived Dyspnea 0-4   ?  ? Progression  ? Progression Continue to progress workloads to maintain intensity without signs/symptoms of physical distress.   ?  ? Resistance Training  ? Training Prescription Yes   ? Weight 5 lbs   ? Reps 10-15   ? ?  ?  ? ?  ? ? ?Perform Capillary Blood Glucose checks as needed. ? ?Exercise Prescription Changes: ? ? Exercise Prescription Changes   ? ? [place] Name 09/03/21 1028 09/17/21 1019 10/01/21 1020  ?  ?  ?  ? Response to Exercise  ? Blood Pressure (Admit) 120/72 120/70 108/60    ? Blood Pressure (Exercise) 138/78 142/80 128/62    ? Blood Pressure (Exit) 100/60 104/70 108/60    ? Heart Rate (Admit) 64 bpm 74 bpm 70 bpm    ? Heart Rate (Exercise) 100 bpm 105 bpm 110 bpm    ? Heart Rate (Exit) 72 bpm 82 bpm 69 bpm    ? Rating of Perceived Exertion (Exercise) '13 13 12    '$ ? Symptoms None None None    ? Comments Off to a great start with exercise. -- Increased WL on TM.    ? Duration Continue with 30 min of aerobic exercise without signs/symptoms of physical distress. Continue with 30 min of aerobic exercise without signs/symptoms of physical distress. Continue with 30 min of aerobic exercise without signs/symptoms of physical distress.    ? Intensity THRR unchanged THRR unchanged THRR unchanged    ?  ? Progression  ? Progression Continue to progress workloads to maintain intensity without signs/symptoms of physical distress. Continue to progress workloads to maintain intensity without signs/symptoms of physical distress. Continue to progress workloads to maintain intensity without signs/symptoms of physical distress.    ? Average METs 3.5 4.3 5    ?  ? Resistance Training  ? Training Prescription Yes Yes Yes    ? Weight 5 lbs 5 lbs 5 lbs    ? Reps 10-15 10-15 10-15    ? Time 10 Minutes 10 Minutes 10 Minutes    ?  ? Interval Training  ?  Interval Training No No No    ?  ? Treadmill  ? MPH 3 3.2  3.8    ? Grade 0 1 1.5    ? Minutes '15 15 15    '$ ? METs 3.3 3.89 4.69    ?  ? NuStep  ? Level '4 5 5    '$ ? SPM 97 97 97    ? Minutes '15 15 15    '$ ? METs 3.7 4.7 5.3    ?  ? Home Exercise Plan  ? Plans to conti

## 2021-10-03 ENCOUNTER — Encounter (HOSPITAL_COMMUNITY)
Admission: RE | Admit: 2021-10-03 | Discharge: 2021-10-03 | Disposition: A | Discharge status: Home / Self Care | Payer: Medicare HMO | Source: Ambulatory Visit | Attending: Cardiology | Admitting: Cardiology

## 2021-10-03 DIAGNOSIS — Z951 Presence of aortocoronary bypass graft: Secondary | ICD-10-CM | POA: Diagnosis not present

## 2021-10-03 DIAGNOSIS — Z48812 Encounter for surgical aftercare following surgery on the circulatory system: Secondary | ICD-10-CM | POA: Diagnosis not present

## 2021-10-05 ENCOUNTER — Encounter (HOSPITAL_COMMUNITY)
Admission: RE | Admit: 2021-10-05 | Discharge: 2021-10-05 | Disposition: A | Discharge status: Home / Self Care | Payer: Medicare HMO | Source: Ambulatory Visit | Attending: Cardiology | Admitting: Cardiology

## 2021-10-05 DIAGNOSIS — Z48812 Encounter for surgical aftercare following surgery on the circulatory system: Secondary | ICD-10-CM | POA: Diagnosis not present

## 2021-10-05 DIAGNOSIS — Z951 Presence of aortocoronary bypass graft: Secondary | ICD-10-CM | POA: Diagnosis not present

## 2021-10-08 ENCOUNTER — Encounter (HOSPITAL_COMMUNITY)
Admission: RE | Admit: 2021-10-08 | Discharge: 2021-10-08 | Disposition: A | Discharge status: Home / Self Care | Payer: Medicare HMO | Source: Ambulatory Visit | Attending: Cardiology | Admitting: Cardiology

## 2021-10-08 DIAGNOSIS — Z951 Presence of aortocoronary bypass graft: Secondary | ICD-10-CM

## 2021-10-08 DIAGNOSIS — Z48812 Encounter for surgical aftercare following surgery on the circulatory system: Secondary | ICD-10-CM | POA: Diagnosis not present

## 2021-10-10 ENCOUNTER — Encounter (HOSPITAL_COMMUNITY)
Admission: RE | Admit: 2021-10-10 | Discharge: 2021-10-10 | Disposition: A | Discharge status: Home / Self Care | Payer: Medicare HMO | Source: Ambulatory Visit | Attending: Cardiology | Admitting: Cardiology

## 2021-10-10 DIAGNOSIS — Z951 Presence of aortocoronary bypass graft: Secondary | ICD-10-CM | POA: Diagnosis not present

## 2021-10-10 DIAGNOSIS — Z48812 Encounter for surgical aftercare following surgery on the circulatory system: Secondary | ICD-10-CM | POA: Diagnosis not present

## 2021-10-12 ENCOUNTER — Encounter (HOSPITAL_COMMUNITY): Payer: Medicare HMO

## 2021-10-15 ENCOUNTER — Encounter (HOSPITAL_COMMUNITY)
Admission: RE | Admit: 2021-10-15 | Discharge: 2021-10-15 | Disposition: A | Discharge status: Home / Self Care | Payer: Medicare HMO | Source: Ambulatory Visit | Attending: Cardiology | Admitting: Cardiology

## 2021-10-15 DIAGNOSIS — Z951 Presence of aortocoronary bypass graft: Secondary | ICD-10-CM

## 2021-10-15 DIAGNOSIS — Z48812 Encounter for surgical aftercare following surgery on the circulatory system: Secondary | ICD-10-CM | POA: Diagnosis not present

## 2021-10-16 NOTE — Progress Notes (Signed)
Reviewed METs and goals on 10/15/2021. ?

## 2021-10-16 NOTE — Progress Notes (Signed)
Walk test performed on 10/15/2021 @ 1040 ?

## 2021-10-17 ENCOUNTER — Encounter (HOSPITAL_COMMUNITY): Payer: Medicare HMO

## 2021-10-17 ENCOUNTER — Telehealth (HOSPITAL_COMMUNITY): Payer: Self-pay | Admitting: Internal Medicine

## 2021-10-19 ENCOUNTER — Encounter (HOSPITAL_COMMUNITY)
Admission: RE | Admit: 2021-10-19 | Discharge: 2021-10-19 | Disposition: A | Discharge status: Home / Self Care | Payer: Medicare HMO | Source: Ambulatory Visit | Attending: Cardiology | Admitting: Cardiology

## 2021-10-19 VITALS — BP 110/64 | HR 67 | Ht 72.0 in | Wt 183.0 lb

## 2021-10-19 DIAGNOSIS — Z48812 Encounter for surgical aftercare following surgery on the circulatory system: Secondary | ICD-10-CM | POA: Diagnosis not present

## 2021-10-19 DIAGNOSIS — Z951 Presence of aortocoronary bypass graft: Secondary | ICD-10-CM | POA: Diagnosis not present

## 2021-10-19 NOTE — Progress Notes (Signed)
Discharge Progress Report ? ?Patient Details  ?Name: Cristian Cantrell ?MRN: [id number] ?Date of Birth: 1950-08-22 ?Referring Provider:   ?Flowsheet Row CARDIAC REHAB PHASE II ORIENTATION from 08/28/2021 in [place]  ?Referring Provider Patwardhan, Reynold Bowen, MD  ? ?  ? ? ? ?Number of Visits: 19 ? ?Reason for Discharge:  ?Patient reached a stable level of exercise. ?Patient independent in their exercise. ?Patient has met program and personal goals. ? ?Smoking History:  ?Social History  ? ?Tobacco Use  ?Smoking Status Never  ?Smokeless Tobacco Never  ? ? ?Diagnosis:  ?06/01/21 CABG x 4 ? ?ADL UCSD: ? ? ?Initial Exercise Prescription: ? Initial Exercise Prescription - 08/28/21 1100   ? ?  ? Date of Initial Exercise RX and Referring Provider  ? Date 08/28/21   ? Referring Provider Nigel Mormon, MD   ? Expected Discharge Date 10/26/21   ?  ? Treadmill  ? MPH 3   ? Grade 0   ? Minutes 15   ? METs 3.3   ?  ? NuStep  ? Level 4   ? SPM 85   ? Minutes 15   ? METs 3.5   ?  ? Prescription Details  ? Frequency (times per week) 3   ? Duration Progress to 30 minutes of continuous aerobic without signs/symptoms of physical distress   ?  ? Intensity  ? THRR 40-80% of Max Heartrate 59-118   ? Ratings of Perceived Exertion 11-13   ? Perceived Dyspnea 0-4   ?  ? Progression  ? Progression Continue to progress workloads to maintain intensity without signs/symptoms of physical distress.   ?  ? Resistance Training  ? Training Prescription Yes   ? Weight 5 lbs   ? Reps 10-15   ? ?  ?  ? ?  ? ? ?Discharge Exercise Prescription (Final Exercise Prescription Changes): ? Exercise Prescription Changes - 10/19/21 1027   ? ?  ? Response to Exercise  ? Blood Pressure (Admit) 110/64   ? Blood Pressure (Exercise) 130/72   ? Blood Pressure (Exit) 98/72   ? Heart Rate (Admit) 67 bpm   ? Heart Rate (Exercise) 112 bpm   ? Heart Rate (Exit) 60 bpm   ? Rating of Perceived Exertion (Exercise) 12   ? Symptoms None   ?  Comments Injured leg playing pickle ball, decreased speed on TM. Last session of cardiac rehab today.   ? Duration Continue with 30 min of aerobic exercise without signs/symptoms of physical distress.   ? Intensity THRR unchanged   ?  ? Progression  ? Progression Continue to progress workloads to maintain intensity without signs/symptoms of physical distress.   ? Average METs 4.7   ?  ? Resistance Training  ? Training Prescription Yes   ? Weight 5 lbs   ? Reps 10-15   ? Time 10 Minutes   ?  ? Interval Training  ? Interval Training No   ?  ? Treadmill  ? MPH 3.2   ? Grade 1.5   ? Minutes 15   ? METs 4.11   ?  ? NuStep  ? Level 5   ? SPM 97   ? Minutes 15   ? METs 5.3   ?  ? Home Exercise Plan  ? Plans to continue exercise at Home (comment)   Walking  ? Frequency Add 4 additional days to program exercise sessions.   ? Initial Home Exercises Provided 09/10/21   ? ?  ?  ? ?  ? ? ?  Functional Capacity: ? 6 Minute Walk   ? ? [place] Name 08/28/21 1105 10/16/21 1346  ?  ?  ? 6 Minute Walk  ? Phase Initial Discharge   ? Distance 2141 feet 2289 feet   ? Distance % Change -- 6.91 %   ? Distance Feet Change -- 148 ft   ? Walk Time 6 minutes 6 minutes   ? # of Rest Breaks 0 0   ? MPH 4.05 4.33   ? METS 4.77 4.64   ? RPE 13 11   ? Perceived Dyspnea  0 0   ? VO2 Peak 16.71 16.25   ? Symptoms No No   ? Resting HR 75 bpm 55 bpm   ? Resting BP 114/62 106/64   ? Resting Oxygen Saturation  99 % 98 %   ? Exercise Oxygen Saturation  during 6 min walk 98 % 99 %   ? Max Ex. HR 111 bpm 87 bpm   ? Max Ex. BP 150/70 140/62   ? 2 Minute Post BP 138/82 102/62   ? ?  ?  ? ?  ? ? ?Psychological, QOL, Others - Outcomes: ?PHQ 2/9: ? ?  10/19/2021  ? 10:55 AM 08/28/2021  ? 12:08 PM  ?Depression screen PHQ 2/9  ?Decreased Interest 0 0  ?Down, Depressed, Hopeless 0 0  ?PHQ - 2 Score 0 0  ? ? ?Quality of Life: ? Quality of Life - 10/16/21 1511   ? ?  ? Quality of Life  ? Select Quality of Life   ?  ? Quality of Life Scores  ? Health/Function Pre 26.2 %   ?  Health/Function Post 26.53 %   ? Health/Function % Change 1.26 %   ? Socioeconomic Pre 28.21 %   ? Socioeconomic Post 28.21 %   ? Socioeconomic % Change  0 %   ? Psych/Spiritual Pre 28.79 %   ? Psych/Spiritual Post 27.21 %   ? Psych/Spiritual % Change -5.49 %   ? Family Pre 30 %   ? Family Post 27.6 %   ? Family % Change -8 %   ? GLOBAL Pre 27.71 %   ? GLOBAL Post 27.18 %   ? GLOBAL % Change -1.91 %   ? ?  ?  ? ?  ? ? ?Personal Goals: ?Goals established at orientation with interventions provided to work toward goal. ? Personal Goals and Risk Factors at Admission - 08/28/21 1031   ? ?  ? Core Components/Risk Factors/Patient Goals on Admission  ? Hypertension Yes   ? Intervention Provide education on lifestyle modifcations including regular physical activity/exercise, weight management, moderate sodium restriction and increased consumption of fresh fruit, vegetables, and low fat dairy, alcohol moderation, and smoking cessation.;Monitor prescription use compliance.   ? Expected Outcomes Short Term: Continued assessment and intervention until BP is < 140/42m HG in hypertensive participants. < 130/857mHG in hypertensive participants with diabetes, heart failure or chronic kidney disease.;Long Term: Maintenance of blood pressure at goal levels.   ? Lipids Yes   ? Intervention Provide education and support for participant on nutrition & aerobic/resistive exercise along with prescribed medications to achieve LDL <7069mHDL >103m80m ? Expected Outcomes Short Term: Participant states understanding of desired cholesterol values and is compliant with medications prescribed. Participant is following exercise prescription and nutrition guidelines.;Long Term: Cholesterol controlled with medications as prescribed, with individualized exercise RX and with personalized nutrition plan. Value goals: LDL < 70mg86mL > 40 mg.   ? ?  ?  ? ?  ?  ? ?  Personal Goals Discharge: ? Goals and Risk Factor Review   ? ? Row Name 09/03/21 1109  10/02/21 1456 10/25/21 0911  ?  ?  ?  ? Core Components/Risk Factors/Patient Goals Review  ? Personal Goals Review Hypertension;Lipids Hypertension;Lipids Hypertension;Lipids    ? Review Ronnie started cardiac rehab on 09/03/21. Ronnie did well with exercise. Vital signs were stable Ronnie has been doing well with exercise at phase 2 cardiac rehab. Vital signs have been stable Ronnie did well with exercise at phase 2 cardiac rehab. Vital signs were stable. Ronnie will completed phase 2 cardiac rehab on 10/19/21    ? Expected Outcomes Ronnie will continue to exercise for exercise, nutrition and lifestyle modifications. Ronnie will continue to exercise for exercise, nutrition and lifestyle modifications. Ronnie will continue to  exercise, follow  nutrition and lifestyle modifications upon completion of phase 2 cardiac rehab.    ? ?  ?  ? ?  ? ? ?Exercise Goals and Review: ? Exercise Goals   ? ? Row Name 08/28/21 1032  ?  ?  ?  ?  ?  ? Exercise Goals  ? Increase Physical Activity Yes      ? Intervention Provide advice, education, support and counseling about physical activity/exercise needs.;Develop an individualized exercise prescription for aerobic and resistive training based on initial evaluation findings, risk stratification, comorbidities and participant's personal goals.      ? Expected Outcomes Short Term: Attend rehab on a regular basis to increase amount of physical activity.;Long Term: Exercising regularly at least 3-5 days a week.;Long Term: Add in home exercise to make exercise part of routine and to increase amount of physical activity.      ? Increase Strength and Stamina Yes      ? Intervention Provide advice, education, support and counseling about physical activity/exercise needs.;Develop an individualized exercise prescription for aerobic and resistive training based on initial evaluation findings, risk stratification, comorbidities and participant's personal goals.      ? Expected Outcomes Short  Term: Increase workloads from initial exercise prescription for resistance, speed, and METs.;Short Term: Perform resistance training exercises routinely during rehab and add in resistance training at home;Long Term:

## 2021-10-22 ENCOUNTER — Encounter (HOSPITAL_COMMUNITY): Payer: Medicare HMO

## 2021-10-24 ENCOUNTER — Encounter (HOSPITAL_COMMUNITY): Payer: Medicare HMO

## 2021-10-26 ENCOUNTER — Encounter (HOSPITAL_COMMUNITY): Payer: Medicare HMO

## 2021-11-29 ENCOUNTER — Other Ambulatory Visit: Payer: Self-pay | Admitting: Cardiology

## 2021-11-29 DIAGNOSIS — I209 Angina pectoris, unspecified: Secondary | ICD-10-CM

## 2021-12-17 ENCOUNTER — Ambulatory Visit (HOSPITAL_COMMUNITY): Payer: Medicare HMO

## 2022-01-07 ENCOUNTER — Ambulatory Visit (HOSPITAL_COMMUNITY): Payer: Medicare HMO

## 2022-01-09 ENCOUNTER — Ambulatory Visit: Payer: Medicare HMO | Admitting: Cardiology

## 2022-01-09 ENCOUNTER — Encounter: Payer: Self-pay | Admitting: Cardiology

## 2022-01-09 ENCOUNTER — Ambulatory Visit (HOSPITAL_COMMUNITY): Payer: Medicare HMO

## 2022-01-09 VITALS — Temp 98.0°F | Resp 16 | Ht 72.0 in | Wt 186.0 lb

## 2022-01-09 DIAGNOSIS — I10 Essential (primary) hypertension: Secondary | ICD-10-CM

## 2022-01-09 DIAGNOSIS — I2510 Atherosclerotic heart disease of native coronary artery without angina pectoris: Secondary | ICD-10-CM

## 2022-01-09 NOTE — Progress Notes (Signed)
Patient referred by Burnard Bunting, MD for chest pain  Subjective:   Cristian Cantrell, male    DOB: 1950-09-24, 71 y.o.   MRN: [id number]   Chief Complaint  Patient presents with   Coronary Artery Disease   Follow-up   Dizziness     HPI  71 y.o. Caucasian male with hypertension, hyperlipidemia, remote h/o gastric ulcer, multivessel CAD, now s/p CABGX4 (LIMA-LAD, SVG-diag, SVG-OM, SVG-PDA) 05/2021  Patient has had occasional episodes of chest pain on deep breathing.  He denies any pain on walking several minutes every day.  He also tried to come off certain blood pressure medications, if possible.  Blood pressure elevated in the office today, but is much lower on his home readings.    Current Outpatient Medications:    amLODipine (NORVASC) 5 MG tablet, Take 5 mg by mouth every evening., Disp: , Rfl:    aspirin EC 81 MG tablet, Take 1 tablet (81 mg total) by mouth daily., Disp: 30 tablet, Rfl: 3   cholecalciferol (VITAMIN D) 1000 UNITS tablet, Take 1,000 Units by mouth in the morning., Disp: , Rfl:    clopidogrel (PLAVIX) 75 MG tablet, TAKE 1 TABLET BY MOUTH DAILY, Disp: 30 tablet, Rfl: 3   levothyroxine (SYNTHROID, LEVOTHROID) 50 MCG tablet, Take 50 mcg by mouth daily before breakfast., Disp: , Rfl:    losartan (COZAAR) 50 MG tablet, Take 50 mg by mouth daily., Disp: , Rfl:    nebivolol (BYSTOLIC) 10 MG tablet, Take 1 tablet (10 mg total) by mouth daily., Disp: 30 tablet, Rfl: 1   nitroGLYCERIN (NITROSTAT) 0.4 MG SL tablet, Place 1 tablet (0.4 mg total) under the tongue every 5 (five) minutes as needed for chest pain., Disp: 30 tablet, Rfl: 3   pantoprazole (PROTONIX) 40 MG tablet, TAKE 1 TABLET BY MOUTH DAILY, Disp: 30 tablet, Rfl: 3   rosuvastatin (CRESTOR) 40 MG tablet, Take 1 tablet (40 mg total) by mouth daily. (Patient taking differently: Take 40 mg by mouth every evening.), Disp: 90 tablet, Rfl: 3   tamsulosin (FLOMAX) 0.4 MG CAPS capsule, Take 0.4 mg by mouth in the  morning., Disp: , Rfl:   Cardiovascular and other pertinent studies:  EKG 01/09/2022: Sinus rhythm 55 bpm T-abnormality, consider anteroseptal ischemia  06/01/2021: CABGX4 (Dr Cyndia Bent) Left internal mammary artery graft to the LAD SVG  to diagonal SVG to OM SVG to PDA  Coronary angiography 05/15/2021: LM: Normal LAD: Tortuous vessel          Prox 95% stenosis w/moderate calcification          Mid eccentric 80% stenosis w/moderate-severe calcification adjacent to medium sized diag 2, followed by a focal hazy 70% stenosis adjacent to small sized diag 3 Lcx: Prox focal 95% stenosis w/mild calcification        Distal Lcx occlusion (small vessel), left-to-left collaterals from distal LAD RCA: Prox 70%, followed by 90% stenosis adjacent to small sized RV marginal that has ostial 95% stenosis   Intermediate complexity multivessel CAD, nondiabetic, normal LVEF Discuss CABG vs complex multivessel PCI for symptom improvement Will refer to CVTS Continue current medical management  Echocardiogram 04/16/2021:  Left ventricle cavity is normal in size and wall thickness. Normal global  wall motion. Normal LV systolic function with EF 61%. Normal diastolic  filling pattern.  Left atrial cavity is mildly dilated.  Structurally normal trileaflet aortic valve. Trace aortic regurgitation.  Mild pulmonic regurgitation.  Normal right atrial pressure.   Recent labs: 08/01/2021: Glucose 99, BUN/Cr  10/1.38. EGFR 54. Na/K 142/4.1.  H/H 13/40. MCV 89. Platelets 189 Chol 111, TG 86, HDL 37, LDL 57  05/26/2021: Glucose 116, BUN/Cr 15/1.50. EGFR 49. Na/K 136/4.1 H/H 10.7/31.5. MCV 93. Platelets 92  05/08/2021: Chol 125, TG 74, HDL 37, LDL 73  03/27/2021: Glucose 97, BUN/Cr 16/1.4. EGFR 49. Na/K 138/4.3. Rest of the CMP normal H/H 14/42. MCV 91. Platelets 174 HbA1C N/A Chol 196, TG 137, HDL 32, LDL 137 TSH 3.1 normal    Review of Systems  Cardiovascular:  Positive for chest pain (Pleuritic).  Negative for dyspnea on exertion, leg swelling, palpitations and syncope.  Gastrointestinal:  Positive for heartburn.         Vitals:   01/09/22 1218 01/09/22 1219  Resp:    Temp:    SpO2: 95% 99%   Orthostatic VS for the past 72 hrs (Last 3 readings):  Orthostatic BP Patient Position BP Location Cuff Size Orthostatic Pulse  01/09/22 1219 152/89 Standing Left Arm Normal 53  01/09/22 1218 135/83 Sitting Left Arm Normal 56  01/09/22 1211 141/84 Supine Left Arm Normal 56     Body mass index is 25.23 kg/m. Filed Weights   01/09/22 1211  Weight: 186 lb (84.4 kg)     Objective:   Physical Exam Vitals and nursing note reviewed.  Constitutional:      General: He is not in acute distress. Neck:     Vascular: No JVD.  Cardiovascular:     Rate and Rhythm: Normal rate and regular rhythm.     Heart sounds: Normal heart sounds. No murmur heard. Pulmonary:     Effort: Pulmonary effort is normal.     Breath sounds: Normal breath sounds. No wheezing or rales.  Musculoskeletal:     Right lower leg: No edema.     Left lower leg: No edema.            Assessment & Recommendations:    71 y.o. Caucasian male with hypertension, hyperlipidemia, remote h/o gastric ulcer, multivessel CAD, now s/p CABGX4 (LIMA-LAD, SVG-diag, SVG-OM, SVG-PDA) 05/2021  CAD: Multivessel CAD, now s/p CABGX4 (LIMA-LAD, SVG-diag, SVG-OM, SVG-PDA) 05/2021 Recent pleuritic chest pain unlikely to be angina.   Discontinue Plavix, continue aspirin 81 mg daily. Overall, he would like to avoid additional medical therapy, unless absolutely essential.  Therefore, we will forego adding Xarelto 2.5 mg twice daily. Continue Crestor 40 mg.  Hypertension: Generally well controlled at home.  He wants to try come off certain medications.  I have asked him to reduce nebivolol to 5 mg daily, and keep a tab of his home blood pressure readings.    F/u in 6 months    Nigel Mormon, MD Pager:  [phone] Office: [phone]

## 2022-01-11 ENCOUNTER — Ambulatory Visit (HOSPITAL_COMMUNITY): Payer: Medicare HMO

## 2022-01-14 ENCOUNTER — Ambulatory Visit: Payer: Medicare HMO | Admitting: Cardiology

## 2022-01-18 ENCOUNTER — Ambulatory Visit (HOSPITAL_COMMUNITY): Payer: Medicare HMO

## 2022-01-25 ENCOUNTER — Ambulatory Visit (HOSPITAL_COMMUNITY): Payer: Medicare HMO

## 2022-02-22 DIAGNOSIS — Z125 Encounter for screening for malignant neoplasm of prostate: Secondary | ICD-10-CM | POA: Diagnosis not present

## 2022-02-22 DIAGNOSIS — R7989 Other specified abnormal findings of blood chemistry: Secondary | ICD-10-CM | POA: Diagnosis not present

## 2022-02-22 DIAGNOSIS — I10 Essential (primary) hypertension: Secondary | ICD-10-CM | POA: Diagnosis not present

## 2022-02-22 DIAGNOSIS — E039 Hypothyroidism, unspecified: Secondary | ICD-10-CM | POA: Diagnosis not present

## 2022-02-22 DIAGNOSIS — E785 Hyperlipidemia, unspecified: Secondary | ICD-10-CM | POA: Diagnosis not present

## 2022-02-26 DIAGNOSIS — Z0000 Encounter for general adult medical examination without abnormal findings: Secondary | ICD-10-CM | POA: Diagnosis not present

## 2022-02-27 DIAGNOSIS — N3944 Nocturnal enuresis: Secondary | ICD-10-CM | POA: Diagnosis not present

## 2022-02-27 DIAGNOSIS — Z23 Encounter for immunization: Secondary | ICD-10-CM | POA: Diagnosis not present

## 2022-02-27 DIAGNOSIS — I129 Hypertensive chronic kidney disease with stage 1 through stage 4 chronic kidney disease, or unspecified chronic kidney disease: Secondary | ICD-10-CM | POA: Diagnosis not present

## 2022-02-27 DIAGNOSIS — D638 Anemia in other chronic diseases classified elsewhere: Secondary | ICD-10-CM | POA: Diagnosis not present

## 2022-02-27 DIAGNOSIS — N401 Enlarged prostate with lower urinary tract symptoms: Secondary | ICD-10-CM | POA: Diagnosis not present

## 2022-02-27 DIAGNOSIS — Z1331 Encounter for screening for depression: Secondary | ICD-10-CM | POA: Diagnosis not present

## 2022-02-27 DIAGNOSIS — E785 Hyperlipidemia, unspecified: Secondary | ICD-10-CM | POA: Diagnosis not present

## 2022-02-27 DIAGNOSIS — Z0000 Encounter for general adult medical examination without abnormal findings: Secondary | ICD-10-CM | POA: Diagnosis not present

## 2022-02-27 DIAGNOSIS — R82998 Other abnormal findings in urine: Secondary | ICD-10-CM | POA: Diagnosis not present

## 2022-02-27 DIAGNOSIS — Z1339 Encounter for screening examination for other mental health and behavioral disorders: Secondary | ICD-10-CM | POA: Diagnosis not present

## 2022-02-27 DIAGNOSIS — N1831 Chronic kidney disease, stage 3a: Secondary | ICD-10-CM | POA: Diagnosis not present

## 2022-04-03 ENCOUNTER — Other Ambulatory Visit: Payer: Self-pay | Admitting: Cardiology

## 2022-04-03 DIAGNOSIS — I209 Angina pectoris, unspecified: Secondary | ICD-10-CM

## 2022-04-11 ENCOUNTER — Ambulatory Visit: Payer: Medicare HMO | Admitting: Cardiology

## 2022-04-11 ENCOUNTER — Ambulatory Visit: Payer: Medicare HMO

## 2022-04-11 VITALS — BP 116/72 | HR 64 | Temp 97.2°F | Resp 16 | Ht 72.0 in | Wt 186.6 lb

## 2022-04-11 DIAGNOSIS — E782 Mixed hyperlipidemia: Secondary | ICD-10-CM

## 2022-04-11 DIAGNOSIS — I10 Essential (primary) hypertension: Secondary | ICD-10-CM

## 2022-04-11 DIAGNOSIS — I2510 Atherosclerotic heart disease of native coronary artery without angina pectoris: Secondary | ICD-10-CM | POA: Diagnosis not present

## 2022-04-11 DIAGNOSIS — R0789 Other chest pain: Secondary | ICD-10-CM

## 2022-04-11 MED ORDER — RANOLAZINE ER 500 MG PO TB12: 500.0000 mg | ORAL_TABLET | Freq: Two times a day (BID) | ORAL | 1 refills | Status: DC

## 2022-04-11 NOTE — Progress Notes (Signed)
Patient referred by Burnard Bunting, MD for chest pain  Subjective:   Cristian Cantrell, male    DOB: [birth year], 71 y.o.   MRN: [id number]   Chief Complaint  Patient presents with   Follow-up    CAD   71 y.o. Caucasian male with hypertension, hyperlipidemia, remote h/o gastric ulcer, multivessel CAD, now s/p CABGX4 (LIMA-LAD, SVG-diag, SVG-OM, SVG-PDA) 05/2021  He presents today for 3 month follow-up. He has experienced episodes of chest pain over the past 3 weeks. Pain is described as burning sensation and last a few minutes to a few hours. He has not used sublingual nitroglycerin and pain resolves on its it. At last visit, plan to was decrease Bystolic to '5mg'$  daily however, he has still been taking '10mg'$  daily. He has noticed that his blood pressure is well controlled in the morning but is sometimes elevated 140s/90s in the evenings but otherwise well controlled. He denies shortness of breath, palpitations, leg edema, orthopnea, PND, syncope.   Current Outpatient Medications:    amLODipine (NORVASC) 5 MG tablet, Take 2.5 mg by mouth every evening., Disp: , Rfl:    aspirin EC 81 MG tablet, Take 1 tablet (81 mg total) by mouth daily., Disp: 30 tablet, Rfl: 3   cholecalciferol (VITAMIN D) 1000 UNITS tablet, Take 1,000 Units by mouth in the morning., Disp: , Rfl:    levothyroxine (SYNTHROID, LEVOTHROID) 50 MCG tablet, Take 50 mcg by mouth daily before breakfast., Disp: , Rfl:    losartan (COZAAR) 50 MG tablet, Take 50 mg by mouth daily., Disp: , Rfl:    nebivolol (BYSTOLIC) 10 MG tablet, Take 1 tablet (10 mg total) by mouth daily., Disp: 30 tablet, Rfl: 1   pantoprazole (PROTONIX) 40 MG tablet, TAKE 1 TABLET BY MOUTH DAILY, Disp: 30 tablet, Rfl: 3   ranolazine (RANEXA) 500 MG 12 hr tablet, Take 1 tablet (500 mg total) by mouth 2 (two) times daily., Disp: 30 tablet, Rfl: 1   rosuvastatin (CRESTOR) 40 MG tablet, Take 1 tablet (40 mg total) by mouth daily. (Patient taking differently: Take 40 mg  by mouth every evening.), Disp: 90 tablet, Rfl: 3   tamsulosin (FLOMAX) 0.4 MG CAPS capsule, Take 0.4 mg by mouth in the morning., Disp: , Rfl:    nitroGLYCERIN (NITROSTAT) 0.4 MG SL tablet, Place 1 tablet (0.4 mg total) under the tongue every 5 (five) minutes as needed for chest pain., Disp: 30 tablet, Rfl: 3  Cardiovascular and other pertinent studies:  EKG 04/11/2022: Sinus bradycardia first-degree AV block at rate of 54 bpm.  Normal axis.  Left atrial enlargement.  Compared to previous EKG on 01/09/2022, no significant change  06/01/2021: CABGX4 (Dr Cyndia Bent) Left internal mammary artery graft to the LAD SVG  to diagonal SVG to OM SVG to PDA  Coronary angiography 05/15/2021: LM: Normal LAD: Tortuous vessel          Prox 95% stenosis w/moderate calcification          Mid eccentric 80% stenosis w/moderate-severe calcification adjacent to medium sized diag 2, followed by a focal hazy 70% stenosis adjacent to small sized diag 3 Lcx: Prox focal 95% stenosis w/mild calcification        Distal Lcx occlusion (small vessel), left-to-left collaterals from distal LAD RCA: Prox 70%, followed by 90% stenosis adjacent to small sized RV marginal that has ostial 95% stenosis   Intermediate complexity multivessel CAD, nondiabetic, normal LVEF Discuss CABG vs complex multivessel PCI for symptom improvement Will refer to CVTS Continue  current medical management  Echocardiogram 04/16/2021:  Left ventricle cavity is normal in size and wall thickness. Normal global  wall motion. Normal LV systolic function with EF 61%. Normal diastolic  filling pattern.  Left atrial cavity is mildly dilated.  Structurally normal trileaflet aortic valve. Trace aortic regurgitation.  Mild pulmonic regurgitation.  Normal right atrial pressure.   Recent labs:     Latest Ref Rng & Units 08/01/2021    9:20 AM 06/05/2021    2:04 AM 06/04/2021    2:21 AM  BMP  Glucose 70 - 99 mg/dL 99  116  108   BUN 8 - 27 mg/dL '10   15  15   '$ Creatinine 0.76 - 1.27 mg/dL 1.38  1.50  1.37   BUN/Creat Ratio 10 - 24 7     Sodium 134 - 144 mmol/L 142  136  138   Potassium 3.5 - 5.2 mmol/L 4.1  4.1  3.7   Chloride 96 - 106 mmol/L 105  102  104   CO2 20 - 29 mmol/L '25  27  27   '$ Calcium 8.6 - 10.2 mg/dL 9.4  8.3  8.2    Lipid Panel     Component Value Date/Time   CHOL 111 08/01/2021 0920   TRIG 86 08/01/2021 0920   HDL 37 (L) 08/01/2021 0920   CHOLHDL 3.0 08/01/2021 0920   LDLCALC 57 08/01/2021 0920   LABVLDL 17 08/01/2021 0920   CBC    Component Value Date/Time   WBC 5.2 08/01/2021 0918   WBC 8.2 06/04/2021 0221   RBC 4.53 08/01/2021 0918   RBC 3.38 (L) 06/04/2021 0221   HGB 13.3 08/01/2021 0918   HCT 40.1 08/01/2021 0918   PLT 189 08/01/2021 0918   MCV 89 08/01/2021 0918   MCH 29.4 08/01/2021 0918   MCH 31.7 06/04/2021 0221   MCHC 33.2 08/01/2021 0918   MCHC 34.0 06/04/2021 0221   RDW 13.0 08/01/2021 0918    Review of Systems  Cardiovascular:  Positive for chest pain. Negative for dyspnea on exertion, leg swelling, palpitations and syncope.  Gastrointestinal:  Positive for heartburn.  Neurological:  Negative for dizziness and light-headedness.    Vitals:   04/11/22 1119  BP: 116/72  Pulse: 64  Resp: 16  Temp: (!) 97.2 F (36.2 C)  SpO2: 97%   Orthostatic VS for the past 72 hrs (Last 3 readings):  Patient Position BP Location Cuff Size  04/11/22 1119 Sitting Left Arm Large    Body mass index is 25.31 kg/m. Filed Weights   04/11/22 1119  Weight: 186 lb 9.6 oz (84.6 kg)    Objective:   Physical Exam Vitals and nursing note reviewed.  Constitutional:      General: He is not in acute distress. Neck:     Vascular: No JVD.  Cardiovascular:     Rate and Rhythm: Normal rate and regular rhythm.     Pulses: Normal pulses.     Heart sounds: Normal heart sounds. No murmur heard.    No gallop.  Pulmonary:     Effort: Pulmonary effort is normal.     Breath sounds: Normal breath sounds. No  wheezing or rales.  Musculoskeletal:     Right lower leg: No edema.     Left lower leg: No edema.    Assessment & Recommendations:   CAD: Multivessel CAD, now s/p CABGX4 (LIMA-LAD, SVG-diag, SVG-OM, SVG-PDA) 05/2021 He has had ongoing chest discomfort since procedure that was felt to the pleuritic, suspect  it could also be related to GERD or atypical chest pain. EKG did not reveal ischemia. Will start Ranexa '500mg'$  twice daily to see if this alleviates chest discomfort. Continue aspirin 81 mg daily. Continue Crestor 40 mg.  Hypertension: Blood pressure is overall well controlled. Loraine Leriche continue current medications without changes.  Advised patient to continue to monitor BP at home and keep log of readings.  Follow-up in  4 weeks or sooner if needed.   Ernst Spell, [place] Pager: [phone] Office: [phone]

## 2022-05-09 ENCOUNTER — Ambulatory Visit: Payer: Medicare HMO

## 2022-05-21 DIAGNOSIS — L570 Actinic keratosis: Secondary | ICD-10-CM | POA: Diagnosis not present

## 2022-05-21 DIAGNOSIS — L820 Inflamed seborrheic keratosis: Secondary | ICD-10-CM | POA: Diagnosis not present

## 2022-05-21 DIAGNOSIS — Z85828 Personal history of other malignant neoplasm of skin: Secondary | ICD-10-CM | POA: Diagnosis not present

## 2022-06-27 DIAGNOSIS — R509 Fever, unspecified: Secondary | ICD-10-CM | POA: Diagnosis not present

## 2022-06-27 DIAGNOSIS — J101 Influenza due to other identified influenza virus with other respiratory manifestations: Secondary | ICD-10-CM | POA: Diagnosis not present

## 2022-06-27 DIAGNOSIS — R062 Wheezing: Secondary | ICD-10-CM | POA: Diagnosis not present

## 2022-08-07 ENCOUNTER — Other Ambulatory Visit: Payer: Self-pay | Admitting: Cardiology

## 2022-08-07 DIAGNOSIS — I209 Angina pectoris, unspecified: Secondary | ICD-10-CM

## 2022-08-23 DIAGNOSIS — D225 Melanocytic nevi of trunk: Secondary | ICD-10-CM | POA: Diagnosis not present

## 2022-08-23 DIAGNOSIS — Z85828 Personal history of other malignant neoplasm of skin: Secondary | ICD-10-CM | POA: Diagnosis not present

## 2022-08-23 DIAGNOSIS — C44612 Basal cell carcinoma of skin of right upper limb, including shoulder: Secondary | ICD-10-CM | POA: Diagnosis not present

## 2022-08-23 DIAGNOSIS — L570 Actinic keratosis: Secondary | ICD-10-CM | POA: Diagnosis not present

## 2022-08-23 DIAGNOSIS — L821 Other seborrheic keratosis: Secondary | ICD-10-CM | POA: Diagnosis not present

## 2022-08-30 ENCOUNTER — Other Ambulatory Visit: Payer: Self-pay | Admitting: Cardiology

## 2022-08-30 DIAGNOSIS — E782 Mixed hyperlipidemia: Secondary | ICD-10-CM

## 2022-09-04 DIAGNOSIS — R0789 Other chest pain: Secondary | ICD-10-CM | POA: Diagnosis not present

## 2022-09-04 DIAGNOSIS — M1990 Unspecified osteoarthritis, unspecified site: Secondary | ICD-10-CM | POA: Diagnosis not present

## 2022-09-04 DIAGNOSIS — L03818 Cellulitis of other sites: Secondary | ICD-10-CM | POA: Diagnosis not present

## 2022-09-04 DIAGNOSIS — E663 Overweight: Secondary | ICD-10-CM | POA: Diagnosis not present

## 2022-09-04 DIAGNOSIS — I10 Essential (primary) hypertension: Secondary | ICD-10-CM | POA: Diagnosis not present

## 2022-09-25 ENCOUNTER — Ambulatory Visit (HOSPITAL_COMMUNITY)
Admission: RE | Admit: 2022-09-25 | Discharge: 2022-09-25 | Disposition: A | Discharge status: Home / Self Care | Payer: Medicare HMO | Source: Ambulatory Visit | Attending: Cardiology | Admitting: Cardiology

## 2022-09-25 ENCOUNTER — Other Ambulatory Visit (HOSPITAL_COMMUNITY): Payer: Self-pay | Admitting: Sports Medicine

## 2022-09-25 DIAGNOSIS — H43813 Vitreous degeneration, bilateral: Secondary | ICD-10-CM | POA: Diagnosis not present

## 2022-09-25 DIAGNOSIS — M7989 Other specified soft tissue disorders: Secondary | ICD-10-CM

## 2022-09-25 DIAGNOSIS — D23112 Other benign neoplasm of skin of right lower eyelid, including canthus: Secondary | ICD-10-CM | POA: Diagnosis not present

## 2022-09-25 DIAGNOSIS — H2513 Age-related nuclear cataract, bilateral: Secondary | ICD-10-CM | POA: Diagnosis not present

## 2022-09-25 DIAGNOSIS — M79604 Pain in right leg: Secondary | ICD-10-CM | POA: Diagnosis not present

## 2022-09-25 DIAGNOSIS — H524 Presbyopia: Secondary | ICD-10-CM | POA: Diagnosis not present

## 2022-12-05 ENCOUNTER — Other Ambulatory Visit: Payer: Self-pay | Admitting: Cardiology

## 2022-12-05 DIAGNOSIS — I209 Angina pectoris, unspecified: Secondary | ICD-10-CM

## 2023-02-26 DIAGNOSIS — E611 Iron deficiency: Secondary | ICD-10-CM | POA: Diagnosis not present

## 2023-02-26 DIAGNOSIS — Z1212 Encounter for screening for malignant neoplasm of rectum: Secondary | ICD-10-CM | POA: Diagnosis not present

## 2023-03-03 DIAGNOSIS — I10 Essential (primary) hypertension: Secondary | ICD-10-CM | POA: Diagnosis not present

## 2023-03-03 DIAGNOSIS — N401 Enlarged prostate with lower urinary tract symptoms: Secondary | ICD-10-CM | POA: Diagnosis not present

## 2023-03-03 DIAGNOSIS — E039 Hypothyroidism, unspecified: Secondary | ICD-10-CM | POA: Diagnosis not present

## 2023-03-03 DIAGNOSIS — D638 Anemia in other chronic diseases classified elsewhere: Secondary | ICD-10-CM | POA: Diagnosis not present

## 2023-03-03 DIAGNOSIS — E785 Hyperlipidemia, unspecified: Secondary | ICD-10-CM | POA: Diagnosis not present

## 2023-03-04 DIAGNOSIS — E611 Iron deficiency: Secondary | ICD-10-CM | POA: Diagnosis not present

## 2023-03-04 DIAGNOSIS — Z1212 Encounter for screening for malignant neoplasm of rectum: Secondary | ICD-10-CM | POA: Diagnosis not present

## 2023-03-05 DIAGNOSIS — Z0000 Encounter for general adult medical examination without abnormal findings: Secondary | ICD-10-CM | POA: Diagnosis not present

## 2023-03-05 DIAGNOSIS — I2510 Atherosclerotic heart disease of native coronary artery without angina pectoris: Secondary | ICD-10-CM | POA: Diagnosis not present

## 2023-03-05 DIAGNOSIS — Z1331 Encounter for screening for depression: Secondary | ICD-10-CM | POA: Diagnosis not present

## 2023-03-05 DIAGNOSIS — I129 Hypertensive chronic kidney disease with stage 1 through stage 4 chronic kidney disease, or unspecified chronic kidney disease: Secondary | ICD-10-CM | POA: Diagnosis not present

## 2023-03-05 DIAGNOSIS — E785 Hyperlipidemia, unspecified: Secondary | ICD-10-CM | POA: Diagnosis not present

## 2023-03-05 DIAGNOSIS — N1831 Chronic kidney disease, stage 3a: Secondary | ICD-10-CM | POA: Diagnosis not present

## 2023-03-05 DIAGNOSIS — E039 Hypothyroidism, unspecified: Secondary | ICD-10-CM | POA: Diagnosis not present

## 2023-03-05 DIAGNOSIS — Z23 Encounter for immunization: Secondary | ICD-10-CM | POA: Diagnosis not present

## 2023-03-05 DIAGNOSIS — I480 Paroxysmal atrial fibrillation: Secondary | ICD-10-CM | POA: Diagnosis not present

## 2023-03-05 DIAGNOSIS — Z1339 Encounter for screening examination for other mental health and behavioral disorders: Secondary | ICD-10-CM | POA: Diagnosis not present

## 2023-03-05 DIAGNOSIS — R82998 Other abnormal findings in urine: Secondary | ICD-10-CM | POA: Diagnosis not present

## 2023-03-05 DIAGNOSIS — N401 Enlarged prostate with lower urinary tract symptoms: Secondary | ICD-10-CM | POA: Diagnosis not present

## 2023-03-05 DIAGNOSIS — K219 Gastro-esophageal reflux disease without esophagitis: Secondary | ICD-10-CM | POA: Diagnosis not present

## 2023-04-10 ENCOUNTER — Other Ambulatory Visit: Payer: Self-pay | Admitting: Cardiology

## 2023-04-10 DIAGNOSIS — I209 Angina pectoris, unspecified: Secondary | ICD-10-CM

## 2023-05-09 ENCOUNTER — Other Ambulatory Visit: Payer: Self-pay | Admitting: Cardiology

## 2023-05-09 DIAGNOSIS — I209 Angina pectoris, unspecified: Secondary | ICD-10-CM

## 2023-08-22 DIAGNOSIS — M25521 Pain in right elbow: Secondary | ICD-10-CM | POA: Diagnosis not present

## 2023-08-22 DIAGNOSIS — M79644 Pain in right finger(s): Secondary | ICD-10-CM | POA: Diagnosis not present

## 2023-08-27 DIAGNOSIS — D485 Neoplasm of uncertain behavior of skin: Secondary | ICD-10-CM | POA: Diagnosis not present

## 2023-08-27 DIAGNOSIS — L570 Actinic keratosis: Secondary | ICD-10-CM | POA: Diagnosis not present

## 2023-08-27 DIAGNOSIS — L814 Other melanin hyperpigmentation: Secondary | ICD-10-CM | POA: Diagnosis not present

## 2023-08-27 DIAGNOSIS — C44329 Squamous cell carcinoma of skin of other parts of face: Secondary | ICD-10-CM | POA: Diagnosis not present

## 2023-08-27 DIAGNOSIS — L821 Other seborrheic keratosis: Secondary | ICD-10-CM | POA: Diagnosis not present

## 2023-08-27 DIAGNOSIS — Z85828 Personal history of other malignant neoplasm of skin: Secondary | ICD-10-CM | POA: Diagnosis not present

## 2023-08-27 DIAGNOSIS — D225 Melanocytic nevi of trunk: Secondary | ICD-10-CM | POA: Diagnosis not present

## 2023-08-27 DIAGNOSIS — L812 Freckles: Secondary | ICD-10-CM | POA: Diagnosis not present

## 2023-08-27 DIAGNOSIS — D2271 Melanocytic nevi of right lower limb, including hip: Secondary | ICD-10-CM | POA: Diagnosis not present

## 2023-09-01 DIAGNOSIS — R0789 Other chest pain: Secondary | ICD-10-CM | POA: Diagnosis not present

## 2023-09-01 DIAGNOSIS — I129 Hypertensive chronic kidney disease with stage 1 through stage 4 chronic kidney disease, or unspecified chronic kidney disease: Secondary | ICD-10-CM | POA: Diagnosis not present

## 2023-09-01 DIAGNOSIS — E785 Hyperlipidemia, unspecified: Secondary | ICD-10-CM | POA: Diagnosis not present

## 2023-09-01 DIAGNOSIS — I480 Paroxysmal atrial fibrillation: Secondary | ICD-10-CM | POA: Diagnosis not present

## 2023-09-01 DIAGNOSIS — I2510 Atherosclerotic heart disease of native coronary artery without angina pectoris: Secondary | ICD-10-CM | POA: Diagnosis not present

## 2023-09-01 DIAGNOSIS — N1831 Chronic kidney disease, stage 3a: Secondary | ICD-10-CM | POA: Diagnosis not present

## 2023-09-01 DIAGNOSIS — E663 Overweight: Secondary | ICD-10-CM | POA: Diagnosis not present

## 2023-10-01 DIAGNOSIS — H5213 Myopia, bilateral: Secondary | ICD-10-CM | POA: Diagnosis not present

## 2023-10-01 DIAGNOSIS — H52203 Unspecified astigmatism, bilateral: Secondary | ICD-10-CM | POA: Diagnosis not present

## 2023-10-01 DIAGNOSIS — D23112 Other benign neoplasm of skin of right lower eyelid, including canthus: Secondary | ICD-10-CM | POA: Diagnosis not present

## 2023-10-01 DIAGNOSIS — D23122 Other benign neoplasm of skin of left lower eyelid, including canthus: Secondary | ICD-10-CM | POA: Diagnosis not present

## 2023-10-01 DIAGNOSIS — H04123 Dry eye syndrome of bilateral lacrimal glands: Secondary | ICD-10-CM | POA: Diagnosis not present

## 2023-10-01 DIAGNOSIS — H43813 Vitreous degeneration, bilateral: Secondary | ICD-10-CM | POA: Diagnosis not present

## 2023-10-01 DIAGNOSIS — H2513 Age-related nuclear cataract, bilateral: Secondary | ICD-10-CM | POA: Diagnosis not present

## 2023-10-01 DIAGNOSIS — H524 Presbyopia: Secondary | ICD-10-CM | POA: Diagnosis not present

## 2023-10-07 DIAGNOSIS — M7021 Olecranon bursitis, right elbow: Secondary | ICD-10-CM | POA: Diagnosis not present

## 2024-03-01 DIAGNOSIS — Z125 Encounter for screening for malignant neoplasm of prostate: Secondary | ICD-10-CM | POA: Diagnosis not present

## 2024-03-01 DIAGNOSIS — Z1212 Encounter for screening for malignant neoplasm of rectum: Secondary | ICD-10-CM | POA: Diagnosis not present

## 2024-03-01 DIAGNOSIS — D638 Anemia in other chronic diseases classified elsewhere: Secondary | ICD-10-CM | POA: Diagnosis not present

## 2024-03-01 DIAGNOSIS — I10 Essential (primary) hypertension: Secondary | ICD-10-CM | POA: Diagnosis not present

## 2024-03-01 DIAGNOSIS — E039 Hypothyroidism, unspecified: Secondary | ICD-10-CM | POA: Diagnosis not present

## 2024-03-01 DIAGNOSIS — E785 Hyperlipidemia, unspecified: Secondary | ICD-10-CM | POA: Diagnosis not present

## 2024-03-01 LAB — LAB REPORT - SCANNED
EGFR (Non-African Amer.): 59
PSA, Total: 3.4
TSH: 3.59 (ref 0.41–5.90)

## 2024-03-08 DIAGNOSIS — I129 Hypertensive chronic kidney disease with stage 1 through stage 4 chronic kidney disease, or unspecified chronic kidney disease: Secondary | ICD-10-CM | POA: Diagnosis not present

## 2024-03-08 DIAGNOSIS — R82998 Other abnormal findings in urine: Secondary | ICD-10-CM | POA: Diagnosis not present

## 2024-03-08 DIAGNOSIS — I480 Paroxysmal atrial fibrillation: Secondary | ICD-10-CM | POA: Diagnosis not present

## 2024-03-08 DIAGNOSIS — E039 Hypothyroidism, unspecified: Secondary | ICD-10-CM | POA: Diagnosis not present

## 2024-03-08 DIAGNOSIS — I10 Essential (primary) hypertension: Secondary | ICD-10-CM | POA: Diagnosis not present

## 2024-03-08 DIAGNOSIS — D638 Anemia in other chronic diseases classified elsewhere: Secondary | ICD-10-CM | POA: Diagnosis not present

## 2024-03-08 DIAGNOSIS — Z0000 Encounter for general adult medical examination without abnormal findings: Secondary | ICD-10-CM | POA: Diagnosis not present

## 2024-03-08 DIAGNOSIS — I2510 Atherosclerotic heart disease of native coronary artery without angina pectoris: Secondary | ICD-10-CM | POA: Diagnosis not present

## 2024-03-08 DIAGNOSIS — N401 Enlarged prostate with lower urinary tract symptoms: Secondary | ICD-10-CM | POA: Diagnosis not present

## 2024-03-08 DIAGNOSIS — R351 Nocturia: Secondary | ICD-10-CM | POA: Diagnosis not present

## 2024-03-08 DIAGNOSIS — Z1339 Encounter for screening examination for other mental health and behavioral disorders: Secondary | ICD-10-CM | POA: Diagnosis not present

## 2024-03-08 DIAGNOSIS — Z1331 Encounter for screening for depression: Secondary | ICD-10-CM | POA: Diagnosis not present

## 2024-04-06 ENCOUNTER — Ambulatory Visit: Admitting: Cardiology

## 2024-05-05 ENCOUNTER — Ambulatory Visit: Attending: Student in an Organized Health Care Education/Training Program | Admitting: Cardiology

## 2024-05-05 ENCOUNTER — Encounter: Payer: Self-pay | Admitting: Cardiology

## 2024-05-05 VITALS — BP 150/82 | HR 58 | Ht 72.0 in | Wt 181.0 lb

## 2024-05-05 DIAGNOSIS — I2510 Atherosclerotic heart disease of native coronary artery without angina pectoris: Secondary | ICD-10-CM | POA: Diagnosis not present

## 2024-05-05 DIAGNOSIS — I10 Essential (primary) hypertension: Secondary | ICD-10-CM

## 2024-05-05 DIAGNOSIS — E782 Mixed hyperlipidemia: Secondary | ICD-10-CM

## 2024-05-05 NOTE — Patient Instructions (Addendum)
 Medication: STOP use of Ranexa    Monitor blood pressure for readings greater than 130 on SBP (top number). If staying greater than 130 SBP (top number) then take two amlodipine.   Testing/Procedures: Echocardiogram  Your physician has requested that you have an echocardiogram. Echocardiography is a painless test that uses sound waves to create images of your heart. It provides your doctor with information about the size and shape of your heart and how well your heart's chambers and valves are working. This procedure takes approximately one hour. There are no restrictions for this procedure. Please do NOT wear cologne, perfume, aftershave, or lotions (deodorant is allowed). Please arrive 15 minutes prior to your appointment time.  Please note: We ask at that you not bring children with you during ultrasound (echo/ vascular) testing. Due to room size and safety concerns, children are not allowed in the ultrasound rooms during exams. Our front office staff cannot provide observation of children in our lobby area while testing is being conducted. An adult accompanying a patient to their appointment will only be allowed in the ultrasound room at the discretion of the ultrasound technician under special circumstances. We apologize for any inconvenience.   Follow-Up: At [facility], you and your health needs are our priority.  As part of our continuing mission to provide you with exceptional heart care, our providers are all part of one team.  This team includes your primary Cardiologist (physician) and Advanced Practice Providers or APPs (Physician Assistants and Nurse Practitioners) who all work together to provide you with the care you need, when you need it.  Your next appointment:   1 year(s)  Provider:   Newman JINNY Lawrence, MD

## 2024-05-05 NOTE — Progress Notes (Signed)
 Cardiology Office Note:  .   Date:  05/05/2024  ID:  Cristian Cantrell, DOB Feb 20, 1951, MRN [id number] PCP: Shepard Ade, MD  [facility] Providers Cardiologist:  Cristian Lawrence, MD PCP: Shepard Ade, MD  Chief Complaint  Patient presents with   Coronary Artery Disease     Cristian Cantrell is a 73 y.o. male with  hypertension, hyperlipidemia, remote h/o gastric ulcer, multivessel CAD, now s/p CABGX4 (LIMA-LAD, SVG-diag, SVG-OM, SVG-PDA) 05/2021    Discussed the use of AI scribe software for clinical note transcription with the patient, who gave verbal consent to proceed.  History of Present Illness Cristian Cantrell is a 73 year old male with coronary artery disease who presents for a routine follow-up.  Cristian remains physically active, engaging in yard work and building a house over the summer, with plans to resume walking and other activities. Cristian experienced a couple of episodes of lightheadedness while working outside. Cristian denies recent chest pain or shortness of breath, even during strenuous activities.  His current medications include aspirin  81 mg, rosuvastatin  40 mg, amlodipine 2.5 mg, losartan 15 mg, and nebivolol  10 mg. Cristian discontinued ranolazine  500 mg twice a day.  Cristian reports nocturnal swelling in his hands over the past couple of months, resolving upon waking and moving, and mild joint pain in his hands. Cristian denies leg swelling and pain when walking. His home blood pressure was last recorded as 120/68 mmHg, though Cristian checks it infrequently. Cristian does not smoke.      Vitals:   05/05/24 0847  BP: (!) 150/74  Pulse: (!) 58  SpO2: 95%      Review of Systems  Cardiovascular:  Negative for chest pain, dyspnea on exertion, leg swelling, palpitations and syncope.        Studies Reviewed: SABRA        EKG 05/05/2024: Sinus rhythm 58 bpm Possible Anterior infarct , age undetermined When compared with ECG of 02-Jun-2021 06:53, ST no longer elevated in Lateral  leads T wave inversion now evident in Anterior leads  Coronary angiogram 2022: LM: Normal LAD: Tortuous vessel          Prox 95% stenosis w/moderate calcification          Mid eccentric 80% stenosis w/moderate-severe calcification adjacent to medium sized diag 2, followed by a focal hazy 70% stenosis adjacent to small sized diag 3 Lcx: Prox focal 95% stenosis w/mild calcification        Distal Lcx occlusion (small vessel), left-to-left collaterals from distal LAD RCA: Prox 70%, followed by 90% stenosis adjacent to small sized RV marginal that has ostial 95% stenosis    Echocardiogram 2022: Left ventricle cavity is normal in size and wall thickness. Normal global wall motion. Normal LV systolic function with EF 61%. Normal diastolic filling pattern. Left atrial cavity is mildly dilated. Structurally normal trileaflet aortic valve. Trace aortic regurgitation.  Mild pulmonic regurgitation. Normal right atrial pressure.   Labs 02/2024: TSH 3.5  Labs 02/2023: Chol 119, TG 5, HDL 40, LDL 66 HbA1C 5.6% Hb 13.3  2023: Cr 1.38   Physical Exam Vitals and nursing note reviewed.  Constitutional:      General: Cristian is not in acute distress. Neck:     Vascular: No JVD.  Cardiovascular:     Rate and Rhythm: Normal rate and regular rhythm.     Heart sounds: Normal heart sounds. No murmur heard. Pulmonary:     Effort: Pulmonary effort is normal.  Breath sounds: Normal breath sounds. No wheezing or rales.  Musculoskeletal:     Right lower leg: No edema.     Left lower leg: No edema.      VISIT DIAGNOSES:   ICD-10-CM   1. Coronary artery disease involving native coronary artery of native heart without angina pectoris  I25.10 EKG 12-Lead    ECHOCARDIOGRAM COMPLETE    2. Mixed hyperlipidemia  E78.2 EKG 12-Lead    3. Primary hypertension  I10        Cristian Cantrell is a 73 y.o. male with  hypertension, hyperlipidemia, remote h/o gastric ulcer, multivessel CAD, now s/p  CABGX4 (LIMA-LAD, SVG-diag, SVG-OM, SVG-PDA) 05/2021   Assessment & Plan  CAD: No recent angina or dyspnea. Physical activity without symptoms. Medications effective. - Continue aspirin  81 mg, rosuvastatin  40 mg, amlodipine 2.5 mg, losartan 15 mg, nebivolol  10 mg. - Ordered echocardiogram. - Advised to report new symptoms such as chest pain or dyspnea.  Mixed hyperlipidemia: Cholesterol levels pending review from recent labs. Continuing rosuvastatin . - Obtain recent lab results from primary care physician. - Continue rosuvastatin  40 mg.  Primary hypertension: Slightly elevated blood pressure likely due to recent activity. Home readings well-controlled. - Encouraged regular home blood pressure monitoring. - Continue amlodipine 2.5 mg, losartan 15 mg, nebivolol  10 mg for now.  If SBP consistently stays >130 mmHg, recommend increasing amlodipine to 5 mg daily.  Arthritis of the hands: Intermittent swelling and mild pain, likely arthritis-related. - Recommended Tylenol  for pain management. - Advised against regular NSAIDs due to aspirin  use. - Suggested local heat application for relief. - Instructed to discuss with primary care physician if symptoms worsen.        F/u in 1 year  Signed, Cristian JINNY Lawrence, MD

## 2024-05-11 ENCOUNTER — Ambulatory Visit: Payer: Self-pay | Admitting: Cardiology

## 2024-06-11 ENCOUNTER — Ambulatory Visit (HOSPITAL_COMMUNITY)
Admission: RE | Admit: 2024-06-11 | Discharge: 2024-06-11 | Disposition: A | Discharge status: Home / Self Care | Source: Ambulatory Visit | Attending: Cardiology | Admitting: Cardiology

## 2024-06-11 DIAGNOSIS — I2510 Atherosclerotic heart disease of native coronary artery without angina pectoris: Secondary | ICD-10-CM | POA: Diagnosis present

## 2024-06-11 LAB — ECHOCARDIOGRAM COMPLETE
AR max vel: 2.05 cm2
AV Area VTI: 1.96 cm2
AV Area mean vel: 1.91 cm2
AV Mean grad: 5.7 mmHg
AV Peak grad: 10.6 mmHg
Ao pk vel: 1.63 m/s
Area-P 1/2: 3.91 cm2
P 1/2 time: 493 ms
S' Lateral: 3 cm

## 2024-06-13 ENCOUNTER — Ambulatory Visit: Payer: Self-pay | Admitting: Cardiology
